# Patient Record
Sex: Female | Born: 1975 | Race: White | Hispanic: No | Smoking: Never smoker
Health system: Southern US, Community
[De-identification: ages and names within clinical notes are randomized; demographics above are authoritative.]

## PROBLEM LIST (undated history)

## (undated) DIAGNOSIS — I10 Essential (primary) hypertension: Secondary | ICD-10-CM

## (undated) HISTORY — DX: Essential (primary) hypertension: I10

## (undated) HISTORY — PX: CHOLECYSTECTOMY: SHX55

---

## 1997-12-30 ENCOUNTER — Emergency Department (HOSPITAL_COMMUNITY): Admission: EM | Admit: 1997-12-30 | Discharge: 1997-12-30 | Payer: Self-pay | Admitting: Emergency Medicine

## 2005-05-18 ENCOUNTER — Other Ambulatory Visit: Admission: RE | Admit: 2005-05-18 | Discharge: 2005-05-18 | Payer: Self-pay | Admitting: Obstetrics and Gynecology

## 2006-02-21 ENCOUNTER — Emergency Department (HOSPITAL_COMMUNITY): Admission: EM | Admit: 2006-02-21 | Discharge: 2006-02-21 | Payer: Self-pay | Admitting: Emergency Medicine

## 2008-08-11 ENCOUNTER — Encounter: Admission: RE | Admit: 2008-08-11 | Discharge: 2008-08-11 | Payer: Self-pay | Admitting: Obstetrics and Gynecology

## 2011-11-29 ENCOUNTER — Telehealth: Payer: Self-pay | Admitting: Obstetrics and Gynecology

## 2011-11-29 MED ORDER — NORETHIN ACE-ETH ESTRAD-FE 1-20 MG-MCG(24) PO CHEW: 1.0000 | CHEWABLE_TABLET | Freq: Every day | ORAL | Status: DC

## 2011-11-29 NOTE — Telephone Encounter (Signed)
VM from pt. No reason given.  Pt [id number]

## 2011-11-29 NOTE — Telephone Encounter (Signed)
Lm on pt vm to call back

## 2011-11-29 NOTE — Telephone Encounter (Signed)
Tc to pt, requests 1 pack of her bc pill, Minastrin to be called in to her pharmacy to last until her aex appt scheduled with EP on 12/25/11. 1 pack sent to pt pharmacy through e-scribe. Pt notified and voiced understanding.

## 2011-12-25 ENCOUNTER — Encounter: Payer: Self-pay | Admitting: Obstetrics and Gynecology

## 2011-12-25 ENCOUNTER — Ambulatory Visit (INDEPENDENT_AMBULATORY_CARE_PROVIDER_SITE_OTHER): Payer: No Typology Code available for payment source | Admitting: Obstetrics and Gynecology

## 2011-12-25 VITALS — BP 130/76 | HR 78 | Ht 61.75 in | Wt 184.0 lb

## 2011-12-25 DIAGNOSIS — Z124 Encounter for screening for malignant neoplasm of cervix: Secondary | ICD-10-CM

## 2011-12-25 DIAGNOSIS — Z01419 Encounter for gynecological examination (general) (routine) without abnormal findings: Secondary | ICD-10-CM

## 2011-12-25 MED ORDER — NORETHIN ACE-ETH ESTRAD-FE 1-20 MG-MCG(24) PO CHEW: 1.0000 | CHEWABLE_TABLET | Freq: Every day | ORAL | Status: DC

## 2011-12-25 NOTE — Progress Notes (Signed)
Subjective:    Elizabeth Mahoney is a 36 y.o. female, G1P0010, who presents for an annual exam. The patient reports no complaints  Menstrual cycle:   LMP: Patient's last menstrual period was 12/04/2011.             Review of Systems Pertinent items are noted in HPI. Denies pelvic pain, urinary tract symptoms, vaginitis symptoms, irregular bleeding, menopausal symptoms, change in bowel habits or rectal bleeding   Objective:    BP 130/76  Pulse 78  Ht 5' 1.75" (1.568 m)  Wt 184 lb (83.462 kg)  BMI 33.93 kg/m2  LMP 12/04/2011    Wt Readings from Last 1 Encounters:  12/25/11 184 lb (83.462 kg)   Body mass index is 33.93 kg/(m^2). General Appearance: Alert, no acute distress HEENT: Grossly normal Neck / Thyroid: Supple, no thyromegaly or cervical adenopathy Lungs: Clear to auscultation bilaterally Back: No CVA tenderness Breast Exam: No masses or nodes.No dimpling, nipple retraction or discharge. Cardiovascular: Regular rate and rhythm.  Gastrointestinal: Soft, non-tender, no masses or organomegaly Pelvic Exam: EGBUS-wnl, vagina-normal rugae, cervix- without lesions or tenderness, uterus appears normal size shape and consistency, adnexae-no masses or tenderness Rectovaginal: no masses and normal sphincter tone Lymphatic Exam: Non-palpable nodes in neck, clavicular,  axillary, or inguinal regions  Skin: no rashes or abnormalities Extremities: no clubbing cyanosis or edema  Neurologic: grossly normal Psychiatric: Alert and oriented  Assessment:   Routine GYN Exam   Plan:  Continue Minastrin FE  #1  1 po qd 11 refills  PAP sent  RTO 1 year or prn  Lera Gaines,ELMIRAPA-C

## 2011-12-25 NOTE — Progress Notes (Signed)
The patient reports: No complaints  Contraception:BCP Minastrin 24 FE  Last mammogram: 08/01/2008 normal Last Pap: 11/03/2010 Normal GC/Chlamydia cultures offered: declined HIV/RPR/HbsAg offered:  declined HSV 1 and 2 glycoprotein offered: declined  Menstrual cycle regular and monthly: Yes every 28 days Menstrual flow normal: Yes lasts 3 days   Urinary symptoms: none Normal bowel movements: Yes Reports abuse at home: No:

## 2011-12-26 LAB — PAP IG W/ RFLX HPV ASCU

## 2013-11-30 ENCOUNTER — Encounter: Payer: Self-pay | Admitting: Obstetrics and Gynecology

## 2016-06-04 ENCOUNTER — Encounter (HOSPITAL_COMMUNITY): Payer: Self-pay | Admitting: Emergency Medicine

## 2016-06-04 DIAGNOSIS — G44209 Tension-type headache, unspecified, not intractable: Secondary | ICD-10-CM | POA: Diagnosis not present

## 2016-06-04 DIAGNOSIS — F321 Major depressive disorder, single episode, moderate: Secondary | ICD-10-CM | POA: Insufficient documentation

## 2016-06-04 DIAGNOSIS — Z79899 Other long term (current) drug therapy: Secondary | ICD-10-CM | POA: Insufficient documentation

## 2016-06-04 DIAGNOSIS — I10 Essential (primary) hypertension: Secondary | ICD-10-CM | POA: Diagnosis not present

## 2016-06-04 DIAGNOSIS — R51 Headache: Secondary | ICD-10-CM | POA: Diagnosis present

## 2016-06-04 LAB — BASIC METABOLIC PANEL
ANION GAP: 9 (ref 5–15)
BUN: 9 mg/dL (ref 6–20)
CHLORIDE: 106 mmol/L (ref 101–111)
CO2: 24 mmol/L (ref 22–32)
Calcium: 9.2 mg/dL (ref 8.9–10.3)
Creatinine, Ser: 0.72 mg/dL (ref 0.44–1.00)
GFR calc non Af Amer: >60 mL/min (ref >60)
Glucose, Bld: 110 mg/dL — ABNORMAL HIGH (ref 65–99)
Potassium: 3.8 mmol/L (ref 3.5–5.1)
Sodium: 139 mmol/L (ref 135–145)

## 2016-06-04 LAB — CBC
HEMATOCRIT: 42.1 % (ref 36.0–46.0)
HEMOGLOBIN: 14 g/dL (ref 12.0–15.0)
MCH: 26.8 pg (ref 26.0–34.0)
MCHC: 33.3 g/dL (ref 30.0–36.0)
MCV: 80.7 fL (ref 78.0–100.0)
Platelets: 312 10*3/uL (ref 150–400)
RBC: 5.22 MIL/uL — AB (ref 3.87–5.11)
RDW: 14.2 % (ref 11.5–15.5)
WBC: 9.5 10*3/uL (ref 4.0–10.5)

## 2016-06-04 LAB — I-STAT BETA HCG BLOOD, ED (MC, WL, AP ONLY): I-stat hCG, quantitative: <5 m[IU]/mL (ref <5)

## 2016-06-04 NOTE — ED Triage Notes (Addendum)
Per EMS pt complaint of generalized weakness on Saturday subsided and started again today in addition to headache. Neuro unremarkable. Pt ambulatory with steady gait to triage room. With triage pt extremities strong throughout, denies pin prick difference when compared to opposite extremity, smile symmetrical.

## 2016-06-05 ENCOUNTER — Emergency Department (HOSPITAL_COMMUNITY)
Admission: EM | Admit: 2016-06-05 | Discharge: 2016-06-05 | Disposition: A | Discharge status: Home / Self Care | Payer: Managed Care, Other (non HMO) | Attending: Emergency Medicine | Admitting: Emergency Medicine

## 2016-06-05 ENCOUNTER — Emergency Department (HOSPITAL_COMMUNITY): Payer: Managed Care, Other (non HMO)

## 2016-06-05 DIAGNOSIS — F321 Major depressive disorder, single episode, moderate: Secondary | ICD-10-CM

## 2016-06-05 DIAGNOSIS — G44209 Tension-type headache, unspecified, not intractable: Secondary | ICD-10-CM

## 2016-06-05 LAB — URINALYSIS, ROUTINE W REFLEX MICROSCOPIC
BILIRUBIN URINE: NEGATIVE
Glucose, UA: NEGATIVE mg/dL
Ketones, ur: NEGATIVE mg/dL
LEUKOCYTES UA: NEGATIVE
NITRITE: NEGATIVE
PH: 5 (ref 5.0–8.0)
Protein, ur: NEGATIVE mg/dL
SPECIFIC GRAVITY, URINE: 1.01 (ref 1.005–1.030)

## 2016-06-05 LAB — HEPATIC FUNCTION PANEL
ALBUMIN: 4.2 g/dL (ref 3.5–5.0)
ALT: 26 U/L (ref 14–54)
AST: 21 U/L (ref 15–41)
Alkaline Phosphatase: 82 U/L (ref 38–126)
BILIRUBIN INDIRECT: 0.6 mg/dL (ref 0.3–0.9)
BILIRUBIN TOTAL: 0.8 mg/dL (ref 0.3–1.2)
Bilirubin, Direct: 0.2 mg/dL (ref 0.1–0.5)
TOTAL PROTEIN: 7.2 g/dL (ref 6.5–8.1)

## 2016-06-05 LAB — RAPID URINE DRUG SCREEN, HOSP PERFORMED
AMPHETAMINES: NOT DETECTED
Barbiturates: NOT DETECTED
Benzodiazepines: NOT DETECTED
Cocaine: NOT DETECTED
Opiates: NOT DETECTED
TETRAHYDROCANNABINOL: NOT DETECTED

## 2016-06-05 LAB — ACETAMINOPHEN LEVEL

## 2016-06-05 LAB — CBG MONITORING, ED: Glucose-Capillary: 103 mg/dL — ABNORMAL HIGH (ref 65–99)

## 2016-06-05 LAB — SALICYLATE LEVEL: Salicylate Lvl: <7 mg/dL (ref 2.8–30.0)

## 2016-06-05 LAB — ETHANOL

## 2016-06-05 MED ORDER — METOCLOPRAMIDE HCL 5 MG/ML IJ SOLN
5.0000 mg | Freq: Once | INTRAMUSCULAR | Status: AC
  Administered 2016-06-05: 5 mg via INTRAVENOUS
  Filled 2016-06-05: qty 2

## 2016-06-05 MED ORDER — SODIUM CHLORIDE 0.9 % IV BOLUS (SEPSIS)
500.0000 mL | Freq: Once | INTRAVENOUS | Status: AC
  Administered 2016-06-05: 500 mL via INTRAVENOUS

## 2016-06-05 MED ORDER — IBUPROFEN 200 MG PO TABS: 600.0000 mg | ORAL_TABLET | Freq: Three times a day (TID) | ORAL | Status: DC | PRN

## 2016-06-05 MED ORDER — DEXAMETHASONE SODIUM PHOSPHATE 10 MG/ML IJ SOLN
10.0000 mg | Freq: Once | INTRAMUSCULAR | Status: AC
  Administered 2016-06-05: 10 mg via INTRAVENOUS
  Filled 2016-06-05: qty 1

## 2016-06-05 MED ORDER — SODIUM CHLORIDE 0.9 % IV BOLUS (SEPSIS)
1000.0000 mL | Freq: Once | INTRAVENOUS | Status: AC
  Administered 2016-06-05: 1000 mL via INTRAVENOUS

## 2016-06-05 MED ORDER — ONDANSETRON HCL 4 MG PO TABS: 4.0000 mg | ORAL_TABLET | Freq: Three times a day (TID) | ORAL | Status: DC | PRN

## 2016-06-05 MED ORDER — DIPHENHYDRAMINE HCL 50 MG/ML IJ SOLN
12.5000 mg | Freq: Once | INTRAMUSCULAR | Status: AC
  Administered 2016-06-05: 12.5 mg via INTRAVENOUS
  Filled 2016-06-05: qty 1

## 2016-06-05 MED ORDER — ZOLPIDEM TARTRATE 10 MG PO TABS: 10.0000 mg | ORAL_TABLET | Freq: Every evening | ORAL | Status: DC | PRN

## 2016-06-05 MED ORDER — ACETAMINOPHEN 325 MG PO TABS: 650.0000 mg | ORAL_TABLET | ORAL | Status: DC | PRN

## 2016-06-05 MED ORDER — ALUM & MAG HYDROXIDE-SIMETH 200-200-20 MG/5ML PO SUSP: 30.0000 mL | ORAL | Status: DC | PRN

## 2016-06-05 NOTE — BH Assessment (Signed)
[facility] Assessment Progress Note  Per Thedore MinsMojeed Akintayo, MD, this pt does not require psychiatric hospitalization at this time.  [facility] to follow up with the Mental Health Intensive Outpatient Program at [facility] [place].  Her scheduled start date is Monday, 06/11/2016 at 08:45.  This has been included in pt's discharge instructions.  Pt's nurse has been notified.  Doylene Canninghomas Amauria Younts, MA Triage Specialist [phone]

## 2016-06-05 NOTE — ED Notes (Signed)
Patient transported to CT 

## 2016-06-05 NOTE — BH Assessment (Addendum)
Assessment Note  Elizabeth Mahoney is an 41 y.o. female. [facility]. Sts that she has been under a lot of stress. Approximately 6 months ago her husband left her. Sts that he gave her a long list of things that she wasn't doing correctly and just left me alone. Sts, "He may may have left me for another women but I'm not really sure". Patient working 2 jobs over the past 2 months. She is also having to care for her home by herself. Sts that everything including yard work is overwhelming for her. She is having a hard time adjusting to living in the home without her spouse. They were married for 22 yrs. Patient has been seeing therapist Elizabeth Mahoney(Elizabeth Mahoney). She thought that therapy was helping but feels she is having a "mental breakdown". She reports ongoing issues with isolating herself from others, fatigue, and hopelessness. She denies HI. No history of aggressive or assaultive behaviors. No legal issues. No AVH's. Patient's father has a history of Schizophrenia. No alcohol or drug use. No history of INPT mental health treatment.   Diagnosis:  Major Depressive Disorder, Recurrent, Severe, without psychotic features  Past Medical History:  Past Medical History:  Diagnosis Date  . Hypertension     Past Surgical History:  Procedure Laterality Date  . CHOLECYSTECTOMY      Family History:  Family History  Problem Relation Age of Onset  . Suicidality Father     Social History:  reports that she has never smoked. She has never used smokeless tobacco. She reports that she drinks alcohol. She reports that she does not use drugs.  Additional Social History:     CIWA: CIWA-Ar BP: (!) 135/93 Pulse Rate: 70 COWS:    Allergies:  Allergies  Allergen Reactions  . Penicillins Nausea Only    Has patient had a PCN reaction causing immediate rash, facial/tongue/throat swelling, SOB or lightheadedness with hypotension: no Has patient had a PCN reaction causing severe  rash involving mucus membranes or skin necrosis: no Has patient had a PCN reaction that required hospitalization: no Has patient had a PCN reaction occurring within the last 10 years: no If all of the above answers are "NO", then may proceed with Cephalosporin use.     Home Medications:  (Not in a Mahoney admission)  OB/GYN Status:  Patient's last menstrual period was 04/30/2016.  General Assessment Data [facility] of Assessment: [facility] TTS Assessment: In system Is this a Tele or Face-to-Face Assessment?: Face-to-Face Is this an Initial Assessment or a Re-assessment for this encounter?: Initial Assessment Marital status: Single Maiden name:  (n/a) Is patient pregnant?: No Pregnancy Status: No Living Arrangements: Alone Can pt return to current living arrangement?: Yes Admission Status: Voluntary Is patient capable of signing voluntary admission?: Yes Referral Source: Self/Family/Friend Insurance type:  Counselling psychologist([employer] )     Crisis Care Plan Living Arrangements: Alone Legal Guardian: Other: (no legal guardian ) Name of Psychiatrist:  (no psychiatrist ) Name of Therapist:  Jeannetta Mahoney(Elizabeth Mahoney )  Education Status Is patient currently in school?: No Current Grade:  (n/a) Highest grade of school patient has completed:  (2 yrs of technical school ) Name of school:  (n/a) Contact person:  (n/a)  Risk to self with the past 6 months Suicidal Ideation: No Has patient been a risk to self within the past 6 months prior to admission? : No Suicidal Intent: No Has patient had any suicidal intent within the past 6 months prior to admission? :  No Is patient at risk for suicide?: No Suicidal Plan?: No Has patient had any suicidal plan within the past 6 months prior to admission? : No Access to Means: No What has been your use of drugs/alcohol within the last 12 months?:  (denies ) Previous Attempts/Gestures: No How many times?:  (0) Other Self Harm Risks:  (denies ) Triggers for Past Attempts:  Other (Comment) (no past attempts or gestures ) Intentional Self Injurious Behavior: None Family Suicide History: Yes (father-Schizophrenia ) Recent stressful life event(s): Other (Comment), Divorce ("My spouse left me"; refinancing, handle house/yard, 2 jobs,) Persecutory voices/beliefs?: No Depression: Yes Depression Symptoms: Feeling angry/irritable, Feeling worthless/self pity, Loss of interest in usual pleasures, Guilt, Fatigue, Isolating, Tearfulness, Insomnia, Despondent Substance abuse history and/or treatment for substance abuse?: No Suicide prevention information given to non-admitted patients: Not applicable  Risk to Others within the past 6 months Homicidal Ideation: No Does patient have any lifetime risk of violence toward others beyond the six months prior to admission? : No Thoughts of Harm to Others: No Current Homicidal Intent: No Current Homicidal Plan: No Access to Homicidal Means: No Identified Victim:  (n/a) History of harm to others?: No Assessment of Violence: None Noted Violent Behavior Description:  (patient calm and cooperative ) Does patient have access to weapons?: No Criminal Charges Pending?: No Does patient have a court date: No Is patient on probation?: No  Psychosis Hallucinations: None noted Delusions: None noted  Mental Status Report Appearance/Hygiene: In scrubs Eye Contact: Good Motor Activity: Freedom of movement Speech: Logical/coherent Level of Consciousness: Alert Mood: Sad Affect: Anxious, Appropriate to circumstance, Depressed Anxiety Level: Minimal Thought Processes: Relevant, Coherent Judgement: Impaired Orientation: Person, Place, Time, Situation Obsessive Compulsive Thoughts/Behaviors: Minimal  Cognitive Functioning Concentration: Decreased Memory: Remote Intact, Recent Intact IQ: Average Insight: Fair Impulse Control: Fair Appetite: Fair Weight Loss:  (33 pounds in the past yr ) Weight Gain:  (none reported ) Sleep:  Decreased Total Hours of Sleep:  (5 to 6 hrs per night ) Vegetative Symptoms: None  ADLScreening [place] [facility]) Patient's cognitive ability adequate to safely complete daily activities?: Yes Patient able to express need for assistance with ADLs?: Yes Independently performs ADLs?: Yes (appropriate for developmental age)  Prior Inpatient Therapy Prior Inpatient Therapy: No Prior Therapy Dates:  (n/a) Prior Therapy Facilty/Provider(s):  (n/a) Reason for Treatment:  (n/a)  Prior Outpatient Therapy Prior Outpatient Therapy: Yes Prior Therapy Dates:  (current ) Prior Therapy Facilty/Provider(s):  Elizabeth Mahoney ) Reason for Treatment:  (therapy/depression) Does patient have an ACCT team?: No Does patient have Intensive In-House Services?  : No Does patient have [facility] services? : No Does patient have P4CC services?: No  ADL Screening (condition at time of admission) Patient's cognitive ability adequate to safely complete daily activities?: Yes Is the patient deaf or have difficulty hearing?: No Does the patient have difficulty seeing, even when wearing glasses/contacts?: No Does the patient have difficulty concentrating, remembering, or making decisions?: No Patient able to express need for assistance with ADLs?: Yes Does the patient have difficulty dressing or bathing?: No Independently performs ADLs?: Yes (appropriate for developmental age) Does the patient have difficulty walking or climbing stairs?: No Weakness of Legs: None Weakness of Arms/Hands: None  Home Assistive Devices/Equipment Home Assistive Devices/Equipment: None    Abuse/Neglect Assessment (Assessment to be complete while patient is alone) Physical Abuse: Denies Verbal Abuse: Denies Sexual Abuse: Denies Exploitation of patient/patient's resources: Denies Self-Neglect: Denies Values / Beliefs Cultural Requests During Hospitalization: None Spiritual Requests  During Hospitalization: None    Advance Directives (For Healthcare) Does Patient Have a Medical Advance Directive?: No Would patient like information on creating a medical advance directive?: No - Patient declined Nutrition Screen- [facility] Patient's home diet: Regular  Additional Information 1:1 In Past 12 Months?: No CIRT Risk: No Elopement Risk: No Does patient have medical clearance?: Yes     Disposition:  Disposition Initial Assessment Completed for this Encounter: Yes  On Site Evaluation by:   Reviewed with Physician:    Melynda Ripple 06/05/2016 9:43 AM

## 2016-06-05 NOTE — BH Assessment (Signed)
Per Dr. Jannifer FranklinAkintayo and Renata Capriceonrad, DNP, patient is psych cleared. Patient [facility] discharge home [facility].

## 2016-06-05 NOTE — ED Provider Notes (Signed)
[facility]-EMERGENCY DEPT Provider Note   CSN: [id number] Arrival date & time: 06/04/16  1826  Time seen 05:30 AM   History   Chief Complaint Chief Complaint  Patient presents with  . Weakness    HPI Elizabeth Mahoney is a 41 y.o. female.  HPI  patient reports on May 5 she started having a headache on the top of her head that she describes as tightness. He states it was gone on 3756. She states it returned yesterday, May 7 around 7 AM. She was started up for the day. She states lights make her eyes hurt. She states playing with her dog outside make the headache feel better. She does not have any noise sensitivity. The pain is not changed by positions. She has no nausea, vomiting, visual changes. She denies any numbness of her extremities. She states both her legs feel tingling. She denies neck pain or fever. She states she's never had this headache before. She denies any recent injury although she states 2 weeks ago she was pulling on a fence and she fell down. She's not sure if she hit her head.  Patient states for the past 2 months she has had 2 jobs. She admits to being fatigued. When we discuss if she is depressed she states "I'm beyond depressed". She does not readily answer if she is suicidal. She states she sees a Veterinary surgeoncounselor and thought she was doing better. She states she does want to see a mental health counselor today once we have her medically feeling better.  PCP none   Past Medical History:  Diagnosis Date  . Hypertension     There are no active problems to display for this patient.   Past Surgical History:  Procedure Laterality Date  . CHOLECYSTECTOMY      OB History    Gravida Para Term Preterm AB Living   1       1 0   SAB TAB Ectopic Multiple Live Births   1               Home Medications    Prior to Admission medications   Medication Sig Start Date End Date Taking? Authorizing Provider  ibuprofen (ADVIL,MOTRIN) 200 MG tablet Take 400 mg by mouth every 6 (six)  hours as needed for headache, mild pain or moderate pain.   Yes [provider]  Melatonin 10 MG CAPS Take 10 mg by mouth at bedtime as needed (sleep).   Yes [provider]  ST JOHNS WORT EXTRACT PO Take 1 tablet by mouth daily.   Yes [provider]  Norethin Ace-Eth Estrad-FE (MINASTRIN 24 FE) 1-20 MG-MCG(24) CHEW Chew 1 tablet by mouth daily. Patient not taking: Reported on 06/05/2016 12/25/11   Henreitta LeberPowell, [place], PA-C    Family History Family History  Problem Relation Age of Onset  . Suicidality Father     Social History Social History  Substance Use Topics  . Smoking status: Never Smoker  . Smokeless tobacco: Never Used  . Alcohol use Yes     Comment: occasional liquor  employed 2 jobs   Allergies   Penicillins   Review of Systems Review of Systems  All other systems reviewed and are negative.    Physical Exam Updated Vital Signs BP (!) 144/88   Pulse 95   Temp 98.4 F (36.9 C) (Oral)   Resp 18   Ht 5\' 2"  (1.575 m)   Wt 184 lb (83.5 kg)   LMP 04/30/2016   SpO2 100%  BMI 33.65 kg/m   Vital signs normal    Physical Exam  Constitutional: She is oriented to person, place, and time. She appears well-developed and well-nourished.  Non-toxic appearance. She does not appear ill. No distress.  Speaks softly  HENT:  Head: Normocephalic and atraumatic.  Right Ear: External ear normal.  Left Ear: External ear normal.  Nose: Nose normal. No mucosal edema or rhinorrhea.  Mouth/Throat: Oropharynx is clear and moist and mucous membranes are normal. No dental abscesses or uvula swelling.  Eyes: Conjunctivae and EOM are normal. Pupils are equal, round, and reactive to light.  Neck: Normal range of motion and full passive range of motion without pain. Neck supple.  Cardiovascular: Normal rate, regular rhythm and normal heart sounds.  Exam reveals no gallop and no friction rub.   No murmur heard. Pulmonary/Chest: Effort normal and breath sounds  normal. No respiratory distress. She has no wheezes. She has no rhonchi. She has no rales. She exhibits no tenderness and no crepitus.  Abdominal: Soft. Normal appearance and bowel sounds are normal. She exhibits no distension. There is no tenderness. There is no rebound and no guarding.  Musculoskeletal: Normal range of motion. She exhibits no edema or tenderness.  Moves all extremities well.   Neurological: She is alert and oriented to person, place, and time. She has normal strength. No cranial nerve deficit.  Grips are equal, patient appears to have difficulty flexing her knees however she states she's walking without difficulty. Her patellar reflexes are 2+ bilaterally  Skin: Skin is warm, dry and intact. No rash noted. No erythema. No pallor.  Psychiatric: Her speech is delayed. She is slowed.  Flat affect with poor eye contact. When we discussed her working the 2 jobs and her feeling depressed she starts getting tearful.  Nursing note and vitals reviewed.    ED Treatments / Results  Labs (all labs ordered are listed, but only abnormal results are displayed) Results for orders placed or performed during the hospital encounter of 06/05/16  Basic metabolic panel  Result Value Ref Range   Sodium 139 135 - 145 mmol/L   Potassium 3.8 3.5 - 5.1 mmol/L   Chloride 106 101 - 111 mmol/L   CO2 24 22 - 32 mmol/L   Glucose, Bld 110 (H) 65 - 99 mg/dL   BUN 9 6 - 20 mg/dL   Creatinine, Ser 1.61 0.44 - 1.00 mg/dL   Calcium 9.2 8.9 - 09.6 mg/dL   GFR calc non Af Amer >60 >60 mL/min   GFR calc Af Amer >60 >60 mL/min   Anion gap 9 5 - 15  CBC  Result Value Ref Range   WBC 9.5 4.0 - 10.5 K/uL   RBC 5.22 (H) 3.87 - 5.11 MIL/uL   Hemoglobin 14.0 12.0 - 15.0 g/dL   HCT 04.5 40.9 - 81.1 %   MCV 80.7 78.0 - 100.0 fL   MCH 26.8 26.0 - 34.0 pg   MCHC 33.3 30.0 - 36.0 g/dL   RDW 91.4 78.2 - 95.6 %   Platelets 312 150 - 400 K/uL  Urinalysis, Routine w reflex microscopic  Result Value Ref Range    Color, Urine YELLOW YELLOW   APPearance CLEAR CLEAR   Specific Gravity, Urine 1.010 1.005 - 1.030   pH 5.0 5.0 - 8.0   Glucose, UA NEGATIVE NEGATIVE mg/dL   Hgb urine dipstick LARGE (A) NEGATIVE   Bilirubin Urine NEGATIVE NEGATIVE   Ketones, ur NEGATIVE NEGATIVE mg/dL   Protein, ur NEGATIVE  NEGATIVE mg/dL   Nitrite NEGATIVE NEGATIVE   Leukocytes, UA NEGATIVE NEGATIVE   RBC / HPF 6-30 0 - 5 RBC/hpf   WBC, UA 0-5 0 - 5 WBC/hpf   Bacteria, UA RARE (A) NONE SEEN   Squamous Epithelial / LPF 0-5 (A) NONE SEEN   Mucous PRESENT   Urine rapid drug screen (hosp performed)  Result Value Ref Range   Opiates NONE DETECTED NONE DETECTED   Cocaine NONE DETECTED NONE DETECTED   Benzodiazepines NONE DETECTED NONE DETECTED   Amphetamines NONE DETECTED NONE DETECTED   Tetrahydrocannabinol NONE DETECTED NONE DETECTED   Barbiturates NONE DETECTED NONE DETECTED  Hepatic function panel  Result Value Ref Range   Total Protein 7.2 6.5 - 8.1 g/dL   Albumin 4.2 3.5 - 5.0 g/dL   AST 21 15 - 41 U/L   ALT 26 14 - 54 U/L   Alkaline Phosphatase 82 38 - 126 U/L   Total Bilirubin 0.8 0.3 - 1.2 mg/dL   Bilirubin, Direct 0.2 0.1 - 0.5 mg/dL   Indirect Bilirubin 0.6 0.3 - 0.9 mg/dL  Ethanol  Result Value Ref Range   Alcohol, Ethyl (B) <5 <5 mg/dL  Acetaminophen level  Result Value Ref Range   Acetaminophen (Tylenol), Serum <10 (L) 10 - 30 ug/mL  Salicylate level  Result Value Ref Range   Salicylate Lvl <7.0 2.8 - 30.0 mg/dL  CBG monitoring, ED  Result Value Ref Range   Glucose-Capillary 103 (H) 65 - 99 mg/dL  I-Stat Beta hCG blood, ED ([facility], [facility], [facility] only)  Result Value Ref Range   I-stat hCG, quantitative <5.0 <5 mIU/mL   Comment 3           Laboratory interpretation all normal except Microscopic hematuria, patient does not remember when her last menstrual cycle was or when it supposed to start again.    EKG  EKG Interpretation None       Radiology Ct Head Wo Contrast  Result Date:  06/05/2016 CLINICAL DATA:  41 y/o  F; fall 2 weeks ago with headache. EXAM: CT HEAD WITHOUT CONTRAST TECHNIQUE: Contiguous axial images were obtained from the base of the skull through the vertex without intravenous contrast. COMPARISON:  None. FINDINGS: Brain: No evidence of acute infarction, hemorrhage, hydrocephalus, extra-axial collection or mass lesion/mass effect. Vascular: No hyperdense vessel or unexpected calcification. Skull: Normal. Negative for fracture or focal lesion. Sinuses/Orbits: No acute finding. Other: None. IMPRESSION: No acute intracranial abnormality identified. Unremarkable CT of the head for age. Electronically Signed   By: Mitzi Hansen M.D.   On: 06/05/2016 06:23    Procedures Procedures (including critical care time)  Medications Ordered in ED Medications  acetaminophen (TYLENOL) tablet 650 mg (not administered)  ibuprofen (ADVIL,MOTRIN) tablet 600 mg (not administered)  zolpidem (AMBIEN) tablet 10 mg (not administered)  ondansetron (ZOFRAN) tablet 4 mg (not administered)  alum & mag hydroxide-simeth (MAALOX/MYLANTA) 200-200-20 MG/5ML suspension 30 mL (not administered)  sodium chloride 0.9 % bolus 1,000 mL (0 mLs Intravenous Stopped 06/05/16 0721)  sodium chloride 0.9 % bolus 500 mL (500 mLs Intravenous New Bag/Given 06/05/16 0721)  metoCLOPramide (REGLAN) injection 5 mg (5 mg Intravenous Given 06/05/16 0617)  diphenhydrAMINE (BENADRYL) injection 12.5 mg (12.5 mg Intravenous Given 06/05/16 0617)  dexamethasone (DECADRON) injection 10 mg (10 mg Intravenous Given 06/05/16 0617)     Initial Impression / Assessment and Plan / ED Course  I have reviewed the triage vital signs and the nursing notes.  Pertinent labs & imaging results that  were available during my care of the patient were reviewed by me and considered in my medical decision making (see chart for details).  She was given IV fluids and low-dose of the migraine cocktail. Due to her having a fall about 2  weeks ago and new onset headaches a CT of the head was done. More likely however her headache is from stress or tension. After her headache is controlled she will have a TTS evaluation.  Recheck at 7:45 AM. Patient states her headache is almost gone. We discussed her head CT results. She is eating breakfast. She is ready to have her TTS consult.  Final Clinical Impressions(s) / ED Diagnoses   Final diagnoses:  Tension headache  Current moderate episode of major depressive disorder without prior episode (HCC)    Disposition pending  Devoria Albe, MD, Concha Pyo, MD 06/05/16 [phone]

## 2016-06-05 NOTE — ED Notes (Signed)
Report given to TCU RN.  

## 2016-06-05 NOTE — ED Notes (Signed)
Patient resting but still waiting to see provider.

## 2016-06-05 NOTE — Discharge Instructions (Signed)
Take ibuprofen 600 mg + acetaminophen 650 mg 4 times a day for headache as needed. Try to get rest. Follow up as discussed with the mental health counselor today.   For your ongoing behavioral health needs, you are advised to follow up with the Mental Health Intensive Outpatient Program (MH-IOP) at the [facility] [place].  You are scheduled to start the program on Monday, Jun 11, 2016 at 8:45 am.  They will be calling you on Friday, Jun 08, 2016 to finalize arrangements.  If you have any questions, contact Jeri Modenaita Clark, MEd, who coordinates the program:       [facility]      [address]      [place], [place] [id number]      Contact person: Jeri ModenaRita Clark, MEd      [phone]

## 2016-06-13 ENCOUNTER — Ambulatory Visit: Payer: Self-pay | Admitting: Physician Assistant

## 2016-06-13 ENCOUNTER — Encounter: Payer: Self-pay | Admitting: Physician Assistant

## 2016-06-13 VITALS — BP 110/70 | HR 77 | Temp 98.5°F

## 2016-06-13 DIAGNOSIS — F32A Depression, unspecified: Secondary | ICD-10-CM

## 2016-06-13 DIAGNOSIS — F329 Major depressive disorder, single episode, unspecified: Secondary | ICD-10-CM

## 2016-06-13 MED ORDER — SERTRALINE HCL 50 MG PO TABS: 50.0000 mg | ORAL_TABLET | Freq: Every day | ORAL | 3 refills | Status: DC

## 2016-06-13 NOTE — Progress Notes (Signed)
S: c/o having depression sx, was suicidal after her husband left her and started going to the [facility], was doing really well and all of sudden had weird pain and panic attacks, ended up in the ER and they told her it was due to her depression, ct head was neg, labs were normal, [facility] also recommended she get on medication, no si/hi at this time  O: vitals wnl, nad, lungs c t a, cv rrr  A: depression  P: zoloft 50mg  qd, recheck in 1 month, labs for thyroid panel, b12, vit d

## 2016-06-14 ENCOUNTER — Other Ambulatory Visit: Payer: Self-pay | Admitting: Physician Assistant

## 2016-06-14 LAB — THYROID PANEL WITH TSH
Free Thyroxine Index: 1.9 (ref 1.2–4.9)
T3 Uptake Ratio: 28 % (ref 24–39)
T4, Total: 6.9 ug/dL (ref 4.5–12.0)
TSH: 0.709 u[IU]/mL (ref 0.450–4.500)

## 2016-06-14 LAB — VITAMIN D 25 HYDROXY (VIT D DEFICIENCY, FRACTURES): Vit D, 25-Hydroxy: 18.2 ng/mL — ABNORMAL LOW (ref 30.0–100.0)

## 2016-06-14 LAB — B12 AND FOLATE PANEL
Folate: 6.9 ng/mL (ref >3.0)
VITAMIN B 12: 502 pg/mL (ref 232–1245)

## 2016-06-14 MED ORDER — VITAMIN D (ERGOCALCIFEROL) 1.25 MG (50000 UNIT) PO CAPS: 50000.0000 [IU] | ORAL_CAPSULE | ORAL | 3 refills | Status: DC

## 2016-06-14 NOTE — Progress Notes (Unsigned)
Vit d deficient, sent rx to pharmacy

## 2016-09-29 ENCOUNTER — Other Ambulatory Visit: Payer: Self-pay | Admitting: Physician Assistant

## 2016-10-04 ENCOUNTER — Other Ambulatory Visit: Payer: Self-pay | Admitting: Physician Assistant

## 2016-10-08 ENCOUNTER — Encounter: Payer: Self-pay | Admitting: Physician Assistant

## 2016-10-08 ENCOUNTER — Ambulatory Visit: Payer: Self-pay | Admitting: Physician Assistant

## 2016-10-08 VITALS — BP 125/70 | HR 75 | Temp 98.5°F | Resp 16

## 2016-10-08 DIAGNOSIS — F411 Generalized anxiety disorder: Secondary | ICD-10-CM

## 2016-10-08 MED ORDER — SERTRALINE HCL 50 MG PO TABS: 50.0000 mg | ORAL_TABLET | Freq: Every day | ORAL | 6 refills | Status: DC

## 2016-10-08 NOTE — Progress Notes (Signed)
S: here for med refill for depression medication, still seeing the EAP and is doing much better, counselor said she could try and wean off of the medication in January, no problems with medication, no si/hi  O:v itals wnl, nad, lungs c t a, cv rrr, good mood and affect  A: med refill for anxiety/depression  P: refill on zoloft 50 mg qd

## 2016-10-15 ENCOUNTER — Ambulatory Visit: Payer: Self-pay | Admitting: Physician Assistant

## 2017-05-10 ENCOUNTER — Ambulatory Visit: Payer: Self-pay | Admitting: Family Medicine

## 2017-05-10 VITALS — BP 144/88 | HR 83 | Temp 98.4°F | Resp 20 | Ht 62.0 in | Wt 203.0 lb

## 2017-05-10 DIAGNOSIS — Z0189 Encounter for other specified special examinations: Principal | ICD-10-CM

## 2017-05-10 DIAGNOSIS — Z008 Encounter for other general examination: Secondary | ICD-10-CM

## 2017-05-10 LAB — GLUCOSE, POCT (MANUAL RESULT ENTRY): POC Glucose: 93 mg/dl (ref 70–99)

## 2017-05-10 NOTE — Progress Notes (Signed)
Subjective: Annual biometrics screening  Patient presents for her annual biometric screening. Patient denies any medical problems.  Patient reports eating a generally well-balanced diet and that she does not eat any fried foods.  Patient reports she does have a problem with eating sugary foods on occasion. Patient works for [employer]. Patient denies any other issues or concerns.   Review of Systems Unremarkable  Objective  Physical Exam General: Awake, alert and oriented. No acute distress. Well developed, hydrated and nourished. Appears stated age.  HEENT: Supple neck without adenopathy. Sclera is non-icteric. The ear canal is clear without discharge. The tympanic membrane is normal in appearance with normal landmarks and cone of light. Nasal mucosa is pink and moist. Oral mucosa is pink and moist. The pharynx is normal in appearance without tonsillar swelling or exudates.  Skin: Skin in warm, dry and intact without rashes or lesions. Appropriate color for ethnicity. Cardiac: Heart rate and rhythm are normal. No murmurs, gallops, or rubs are auscultated.  Respiratory: The chest wall is symmetric and without deformity. No signs of respiratory distress. Lung sounds are clear in all lobes bilaterally without rales, ronchi, or wheezes.  Neurological: The patient is awake, alert and oriented to person, place, and time with normal speech.  Memory is normal and thought processes intact. No gait abnormalities are appreciated.  Psychiatric: Appropriate mood and affect.   Assessment Annual biometrics screening  Plan  Lipid panel pending. Encouraged routine visits with primary care provider.  Fasting blood sugar 93 today.  Patient's blood pressure elevated at 144/88 today.  Patient reports this is related to having her blood drawn immediately before having her blood pressure taken.  Patient checks her blood pressure periodically and it is typically much lower than this.  Advised patient to check this  periodically and to report values of 140/90 or higher to her PCP.  Encouraged a well-balanced diet and exercise.

## 2017-05-11 LAB — LIPID PANEL
CHOL/HDL RATIO: 2.8 ratio (ref 0.0–4.4)
CHOLESTEROL TOTAL: 165 mg/dL (ref 100–199)
HDL: 58 mg/dL (ref >39)
LDL CALC: 91 mg/dL (ref 0–99)
TRIGLYCERIDES: 79 mg/dL (ref 0–149)
VLDL CHOLESTEROL CAL: 16 mg/dL (ref 5–40)

## 2017-05-13 NOTE — Progress Notes (Signed)
Lipid panel WNL

## 2017-10-05 IMAGING — CT CT HEAD W/O CM
3 of 4 series · 15 of 47 positions shown, 18 images · non-contrast
Comparison: None.

CLINICAL DATA: 41 y/o  F; fall 2 weeks ago with headache.

EXAM:
CT HEAD WITHOUT CONTRAST
TECHNIQUE: Contiguous axial images were obtained from the base of the skull
through the vertex without intravenous contrast.

[Series 2: head w/o · axial · non-contrast · 0.45mm/px · z∈[-215,-95]mm · 9 of 31 slices shown, 12 images]
[im 4/31  brain]
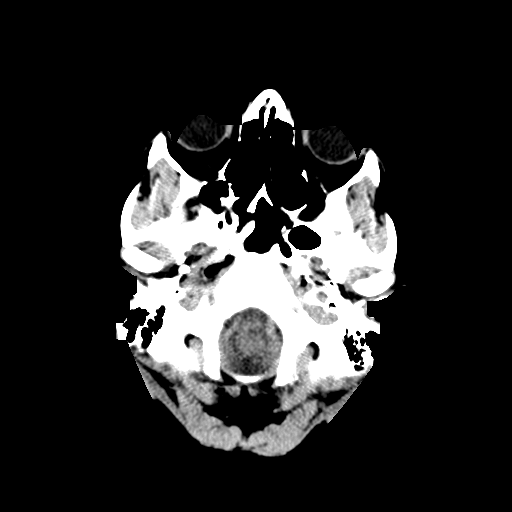
[im 4/31  bone]
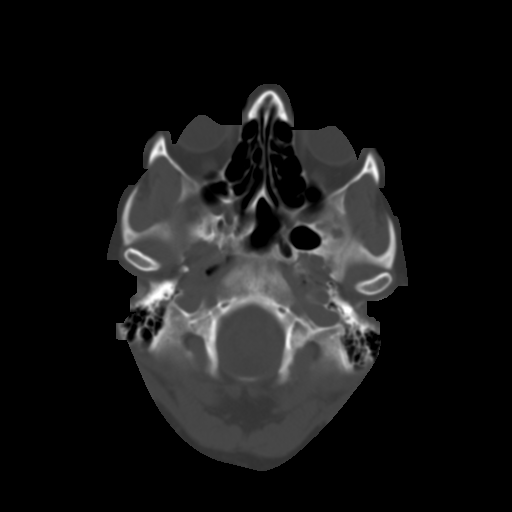
[im 7/31  brain]
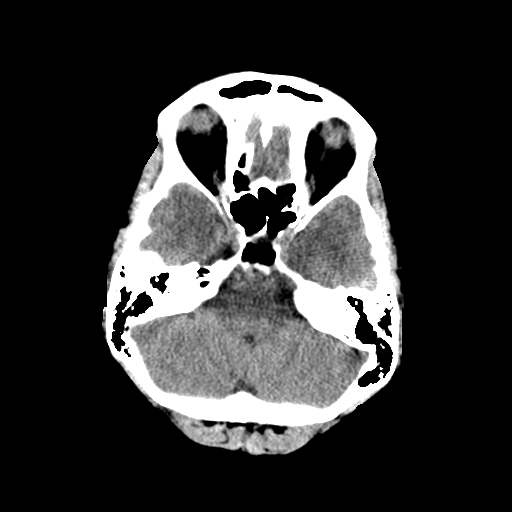
[im 10/31  brain]
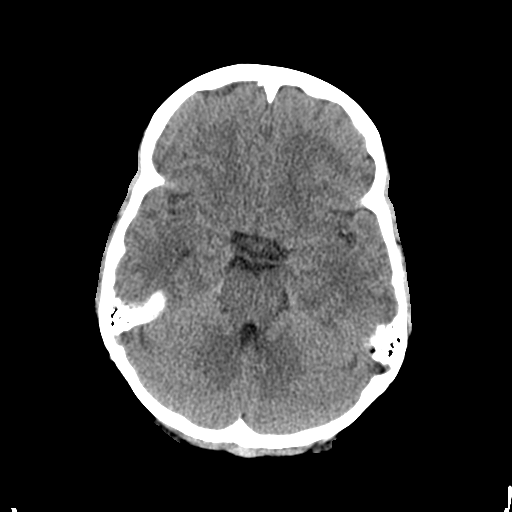
[im 13/31  brain]
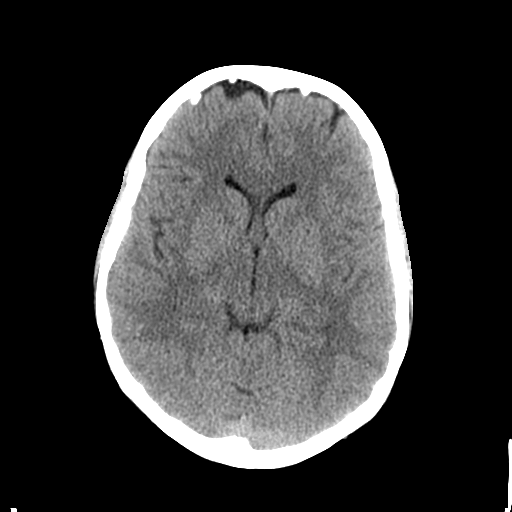
[im 16/31  brain]
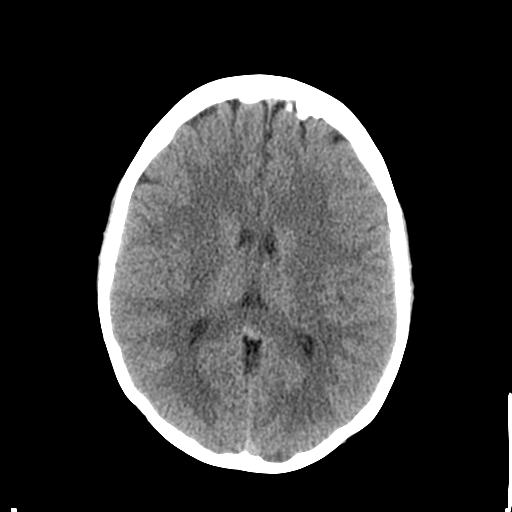
[im 16/31  bone]
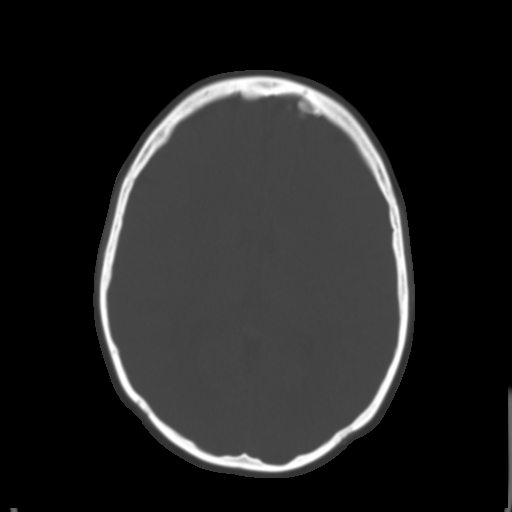
[im 19/31  brain]
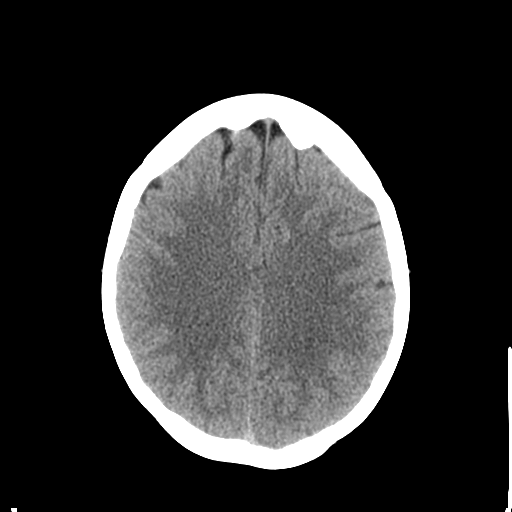
[im 22/31  brain]
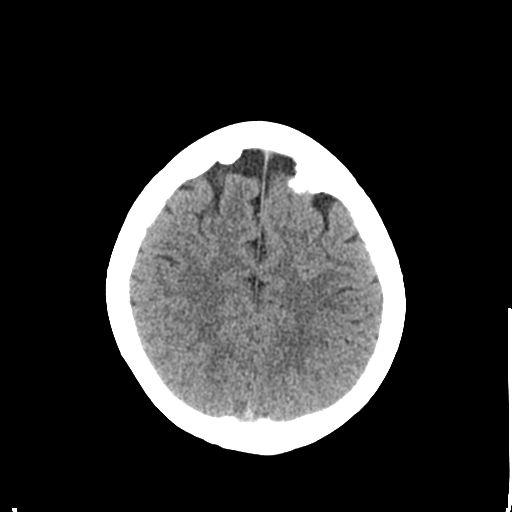
[im 25/31  brain]
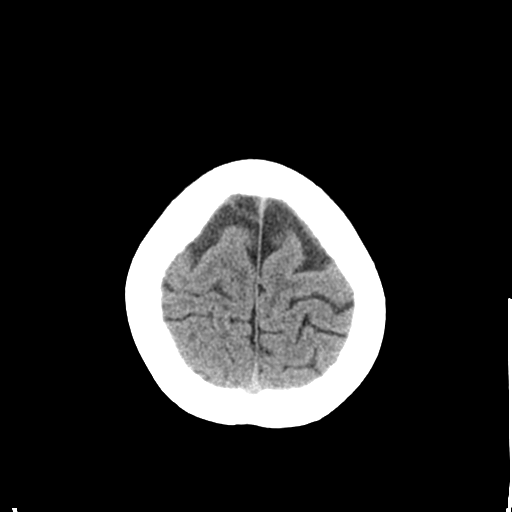
[im 28/31  brain]
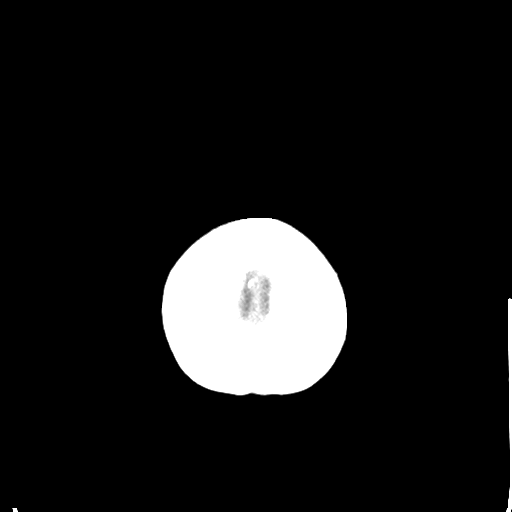
[im 28/31  bone]
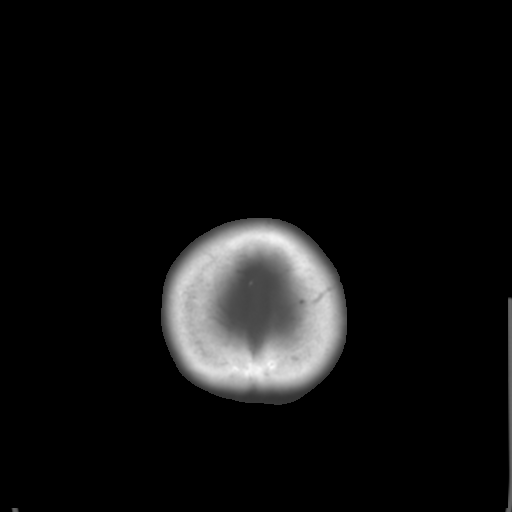

[Series 4: coronal · coronal · 0.27mm/px · 3 of 60 slices shown]
[im 20/60  brain]
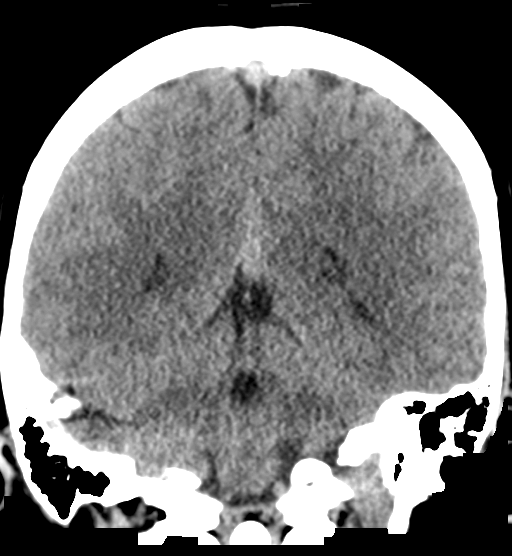
[im 27/60  brain]
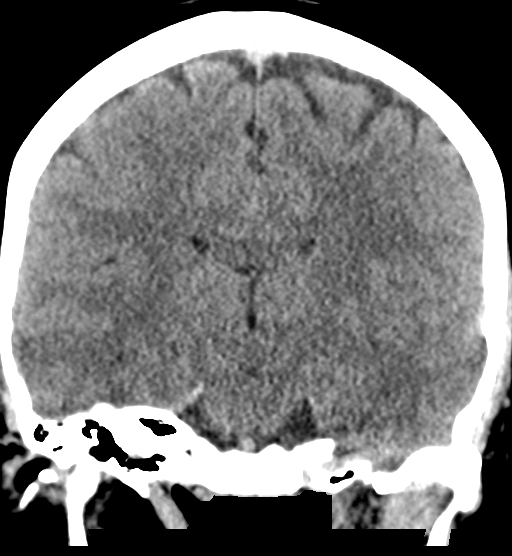
[im 33/60  brain]
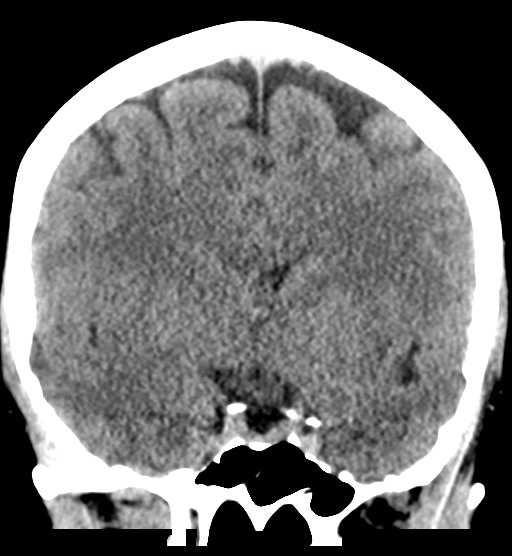

[Series 5: sagittal · sagittal · 0.29mm/px · 3 of 48 slices shown]
[im 16/48  brain]
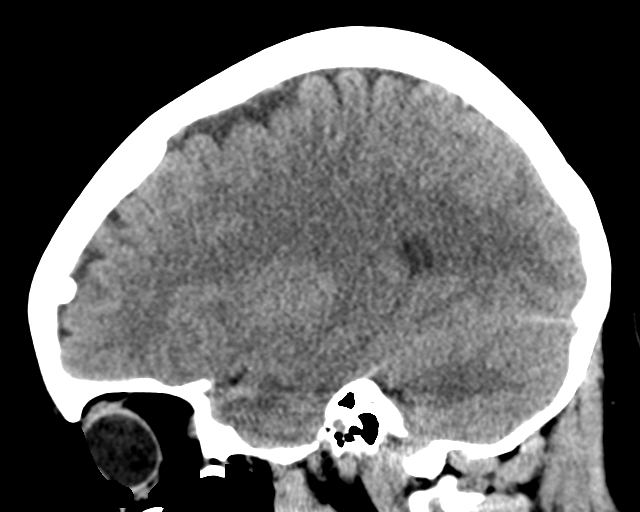
[im 24/48  brain]
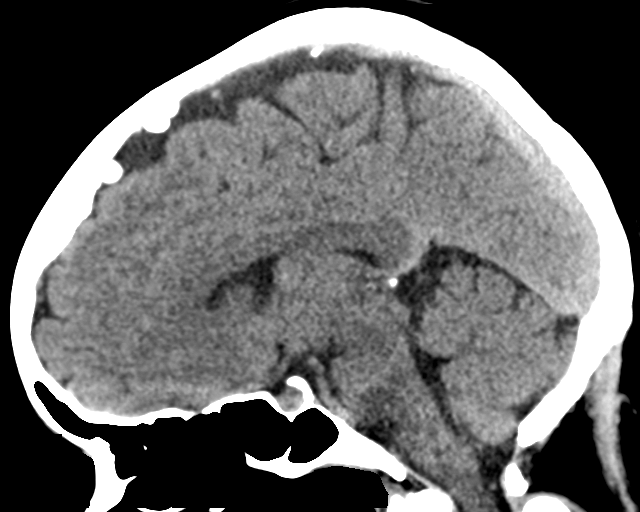
[im 32/48  brain]
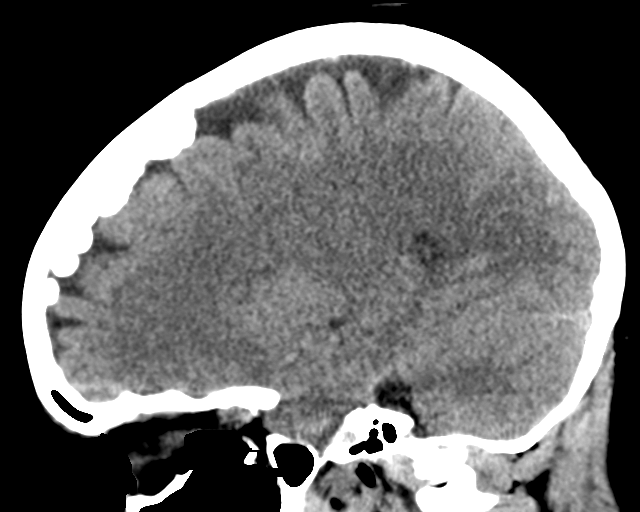

[15 of 47 positions shown; findings below may reference images not displayed]

FINDINGS: Brain: No evidence of acute infarction, hemorrhage, hydrocephalus,
extra-axial collection or mass lesion/mass effect.

Vascular: No hyperdense vessel or unexpected calcification.

Skull: Normal. Negative for fracture or focal lesion.

Sinuses/Orbits: No acute finding.

Other: None.
IMPRESSION: No acute intracranial abnormality identified. Unremarkable CT of the
head for age.

By: Khalilah Holub M.D.

## 2018-10-21 ENCOUNTER — Other Ambulatory Visit: Payer: Self-pay

## 2018-10-21 ENCOUNTER — Ambulatory Visit: Payer: Managed Care, Other (non HMO) | Admitting: Adult Health

## 2018-10-21 ENCOUNTER — Encounter: Payer: Self-pay | Admitting: Adult Health

## 2018-10-21 VITALS — BP 118/82 | HR 77 | Temp 97.3°F | Resp 18 | Ht 63.0 in | Wt 208.0 lb

## 2018-10-21 DIAGNOSIS — Z0189 Encounter for other specified special examinations: Secondary | ICD-10-CM | POA: Diagnosis not present

## 2018-10-21 DIAGNOSIS — Z008 Encounter for other general examination: Secondary | ICD-10-CM

## 2018-10-21 DIAGNOSIS — E669 Obesity, unspecified: Secondary | ICD-10-CM | POA: Insufficient documentation

## 2018-10-21 NOTE — Progress Notes (Signed)
[facility]  Elizabeth Mahoney DOB: 43 y.o. MRN: [id number]  Subjective:  Here for Biometric Screen/brief exam Patient is a 43 year old female in no acute distress who comes to the clinic for a biometric screening and brief exam.  Reports he does have a primary care provider but she has not gone recently, she has not had a Pap smear in 3 years and knows that she needs to schedule one with her gynecologist. She denies any health concerns at this visit reports she feels well tries to exercise and eat healthy. He denies taking any daily medications or over-the-counter medications.  She denies any chronic health problems or concerns Patient  denies any fever, body aches,chills, rash, chest pain, shortness of breath, nausea, vomiting, or diarrhea.   Objective: Blood pressure 118/82, pulse 77, temperature (!) 97.3 F (36.3 C), temperature source Temporal, resp. rate 18, height 5\' 3"  (1.6 m), weight 208 lb (94.3 kg), SpO2 98 %.  Temperature is 98.3 with 1 degree added as temporal thermometer reads 1 degree lower than normal  NAD, well-developed well-nourished HEENT: Within normal limits Neck: Normal, supple, no cervical lymphadenopathy Heart: Regular rate and rhythm without murmurs rubs or gallops Lungs: Clear to auscultation without any adventitious lung sounds Patient is alert and oriented and responsive to questions Engages in eye contact with provider. Speaks in full sentences without any pauses without any shortness of breath or distress.  Patient moves on and off of exam table and in room without difficulty. Gait is normal in hall and in room. Patient is oriented to person place time and situation. Patient answers questions appropriately and engages in conversation.   Assessment: Biometric screen Encounter for other general examination-not a full annual physical, biometric screening and brief exam only - Plan: Lipid Panel With LDL/HDL Ratio,  Glucose, random  Encounter for biometric screening - Plan: Lipid Panel With LDL/HDL Ratio, Glucose, random    Plan: The Biometric exam is a brief physical with labs including glucose and cholesterol. This does not replace a full physical with a primary care provider, and additional recommended labs. This is an acute care clinic not for maintenance of chronic or long standing conditions.   Provider also recommends if you do not have a primary care provider for patient to establish care promptly.You can choose any provider of your choice at any facility of your choice, the below information is  just a resource to aid in you finding a primary care provider for routine health maintenance.   [place]  PHYSICIAN/PROVIDER  REFERRAL LINE at [phone]  [email or web address] to help assist with finding a primary care doctor.   Helpful resources below of other primary care office's accepting new patients.   [phone]         [address]  [place]. [place] [phone]  . [facility]    [address] [place], [place] [phone]  . [facility] [address]  [place], [place]  [phone]   . [phone] [facility]  [address], [address] [place] [phone]    Follow up with primary care as needed for chronic and maintenance health care- can be seen in this employee clinic for acute care.    I will have the office call you on your glucose and cholesterol results when they return if you have not heard within 1 week please  call the office.  This biometric physical is a brief physical and the only labs done are glucose and your lipid panel(cholesterol) and is  not a substitute for seeing a primary care provider for a complete annual physical. Please see a primary care physician for routine health maintenance, labs and full physical at  least yearly and follow up as recommended by your provider. Provider also recommends if you do not have a primary care provider for patient to establish care as soon as possible .Patient may chose provider of choice. Also gave the [place] at [phone] or web site at [email or web address] to help assist with finding a primary care doctor.  Patient verbalizes understanding that his office is acute care only and not a substitute for a primary care or for the management of chronic conditions.    Fasting glucose and lipids. Discussed with patient that today's visit here is a limited biometric screening visit (not a comprehensive exam or management of any chronic problems) Discussed some health issues, including healthy eating habits and exercise. Encouraged to follow-up with PCP for annual comprehensive preventive and wellness care (and if applicable, any chronic issues). Questions invited and answered.

## 2018-10-21 NOTE — Patient Instructions (Signed)
Provider also recommends patient see primary care physician for a routine physical and to establish primary care. Patient may chose provider of choice. Also gave the [place] at [phone] or web site at [email or web address] to help assist with finding a primary care doctor. Patient understands this office is acute care office only and no primary care is done at this office.   The Biometric exam is a brief physical with labs including glucose and cholesterol. This does not replace a full physical with a primary care provider, and additional recommended labs. This is an acute care clinic not for maintenance of chronic or long standing conditions.   Provider also recommends if you do not have a primary care provider for patient to establish care promptly.You can choose any provider of your choice at any facility of your choice, the below information is  just a resource to aid in you finding a primary care provider for routine health maintenance.   [place]  PHYSICIAN/PROVIDER  REFERRAL LINE at [phone]  [email or web address] to help assist with finding a primary care doctor.   Helpful resources below of other primary care office's accepting new patients.   [facility] P[facility]         [address]  [place]. [place] [address] [phone]  . [facility]    [address], [place], [place] [address] [phone]  . [facility] [address] [place], [place]  [phone]   . [place] [facility]  [address] [place] [address] [phone]    Follow up with primary care as needed for chronic and maintenance health care- can be seen in this employee clinic for acute care.    I will have the office call you on your glucose and cholesterol results when they return if you have not heard within 1 week please call the office.  This  biometric physical is a brief physical and the only labs done are glucose and your lipid panel(cholesterol) and is  not a substitute for seeing a primary care provider for a complete annual physical. Please see a primary care physician for routine health maintenance, labs and full physical at least yearly and follow up as recommended by your provider. Provider also recommends if you do not have a primary care provider for patient to establish care as soon as possible .Patient may chose provider of choice. Also gave the [place] at [phone] or web site at [email or web address] to help assist with finding a primary care doctor.  Patient verbalizes understanding that his office is acute care only and not a substitute for a primary care or for the management of chronic conditions.    Health Maintenance, Female Adopting a healthy lifestyle and getting preventive care are important in promoting health and wellness. Ask your health care provider about:  The right schedule for you to have regular tests and exams.  Things you can do on your own to prevent diseases and keep yourself healthy. What should I know about diet, weight, and exercise? Eat a healthy diet   Eat a diet that includes plenty of vegetables, fruits, low-fat dairy products, and lean protein.  Do not eat a lot of foods that are high in solid fats, added sugars, or sodium. Maintain a healthy weight Body mass index (BMI) is used to identify weight problems.  It estimates body fat based on height and weight. Your health care provider can help determine your BMI and help you achieve or maintain a healthy weight. Get regular exercise Get regular exercise. This is one of the most important things you can do for your health. Most adults should:  Exercise for at least 150 minutes each week. The exercise should increase your heart rate and make you sweat (moderate-intensity exercise).  Do strengthening  exercises at least twice a week. This is in addition to the moderate-intensity exercise.  Spend less time sitting. Even light physical activity can be beneficial. Watch cholesterol and blood lipids Have your blood tested for lipids and cholesterol at 43 years of age, then have this test every 5 years. Have your cholesterol levels checked more often if:  Your lipid or cholesterol levels are high.  You are older than 43 years of age.  You are at high risk for heart disease. What should I know about cancer screening? Depending on your health history and family history, you may need to have cancer screening at various ages. This may include screening for:  Breast cancer.  Cervical cancer.  Colorectal cancer.  Skin cancer.  Lung cancer. What should I know about heart disease, diabetes, and high blood pressure? Blood pressure and heart disease  High blood pressure causes heart disease and increases the risk of stroke. This is more likely to develop in people who have high blood pressure readings, are of African descent, or are overweight.  Have your blood pressure checked: ? Every 3-5 years if you are 10-45 years of age. ? Every year if you are 41 years old or older. Diabetes Have regular diabetes screenings. This checks your fasting blood sugar level. Have the screening done:  Once every three years after age 45 if you are at a normal weight and have a low risk for diabetes.  More often and at a younger age if you are overweight or have a high risk for diabetes. What should I know about preventing infection? Hepatitis B If you have a higher risk for hepatitis B, you should be screened for this virus. Talk with your health care provider to find out if you are at risk for hepatitis B infection. Hepatitis C Testing is recommended for:  Everyone born from [birth year] through 1965.  Anyone with known risk factors for hepatitis C. Sexually transmitted infections (STIs)  Get screened  for STIs, including gonorrhea and chlamydia, if: ? You are sexually active and are younger than 43 years of age. ? You are older than 43 years of age and your health care provider tells you that you are at risk for this type of infection. ? Your sexual activity has changed since you were last screened, and you are at increased risk for chlamydia or gonorrhea. Ask your health care provider if you are at risk.  Ask your health care provider about whether you are at high risk for HIV. Your health care provider may recommend a prescription medicine to help prevent HIV infection. If you choose to take medicine to prevent HIV, you should first get tested for HIV. You should then be tested every 3 months for as long as you are taking the medicine. Pregnancy  If you are about to stop having your period (premenopausal) and you may become pregnant, seek counseling before you get pregnant.  Take 400 to 800 micrograms (mcg) of folic acid every day if you become pregnant.  Ask for birth control (contraception) if  you want to prevent pregnancy. Osteoporosis and menopause Osteoporosis is a disease in which the bones lose minerals and strength with aging. This can result in bone fractures. If you are 31 years old or older, or if you are at risk for osteoporosis and fractures, ask your health care provider if you should:  Be screened for bone loss.  Take a calcium or vitamin D supplement to lower your risk of fractures.  Be given hormone replacement therapy (HRT) to treat symptoms of menopause. Follow these instructions at home: Lifestyle  Do not use any products that contain nicotine or tobacco, such as cigarettes, e-cigarettes, and chewing tobacco. If you need help quitting, ask your health care provider.  Do not use street drugs.  Do not share needles.  Ask your health care provider for help if you need support or information about quitting drugs. Alcohol use  Do not drink alcohol if: ? Your  health care provider tells you not to drink. ? You are pregnant, may be pregnant, or are planning to become pregnant.  If you drink alcohol: ? Limit how much you use to 0-1 drink a day. ? Limit intake if you are breastfeeding.  Be aware of how much alcohol is in your drink. In the [place] one drink equals one 12 oz bottle of beer (355 mL), one 5 oz glass of wine (148 mL), or one 1 oz glass of hard liquor (44 mL). General instructions  Schedule regular health, dental, and eye exams.  Stay current with your vaccines.  Tell your health care provider if: ? You often feel depressed. ? You have ever been abused or do not feel safe at home. Summary  Adopting a healthy lifestyle and getting preventive care are important in promoting health and wellness.  Follow your health care provider's instructions about healthy diet, exercising, and getting tested or screened for diseases.  Follow your health care provider's instructions on monitoring your cholesterol and blood pressure. This information is not intended to replace advice given to you by your health care provider. Make sure you discuss any questions you have with your health care provider. Document Released: 07/31/2010 Document Revised: 01/08/2018 Document Reviewed: 01/08/2018 Elsevier Patient Education  2020 [employer].

## 2018-10-22 LAB — LIPID PANEL WITH LDL/HDL RATIO
Cholesterol, Total: 175 mg/dL (ref 100–199)
HDL: 55 mg/dL (ref >39)
LDL Chol Calc (NIH): 104 mg/dL — ABNORMAL HIGH (ref 0–99)
LDL/HDL Ratio: 1.9 ratio (ref 0.0–3.2)
Triglycerides: 89 mg/dL (ref 0–149)
VLDL Cholesterol Cal: 16 mg/dL (ref 5–40)

## 2018-10-22 LAB — GLUCOSE, RANDOM: Glucose: 94 mg/dL (ref 65–99)

## 2019-09-14 ENCOUNTER — Ambulatory Visit: Payer: Managed Care, Other (non HMO) | Attending: Internal Medicine

## 2019-09-14 DIAGNOSIS — Z23 Encounter for immunization: Secondary | ICD-10-CM

## 2019-09-14 NOTE — Progress Notes (Signed)
   Covid-19 Vaccination Clinic  Name:  Elizabeth Mahoney    MRN: [id number] DOB: Dec 17, 1975  09/14/2019  [name] was observed post Covid-19 immunization for 15 minutes without incident. She was provided with Vaccine Information Sheet and instruction to access the V-Safe system.   [name] was instructed to call 911 with any severe reactions post vaccine: Marland Kitchen Difficulty breathing  . Swelling of face and throat  . A fast heartbeat  . A bad rash all over body  . Dizziness and weakness   Immunizations Administered    Name Date Dose VIS Date Route   Pfizer COVID-19 Vaccine 09/14/2019  2:35 PM 0.3 mL 03/25/2018 Intramuscular   Manufacturer: [employer], [employer]   Lot: [id number]   NDC: [address]-1

## 2019-11-06 ENCOUNTER — Ambulatory Visit: Payer: Managed Care, Other (non HMO) | Admitting: Nurse Practitioner

## 2019-11-06 ENCOUNTER — Encounter: Payer: Self-pay | Admitting: Nurse Practitioner

## 2019-11-06 VITALS — BP 129/73 | HR 58 | Ht 63.0 in | Wt 204.0 lb

## 2019-11-06 DIAGNOSIS — Z0000 Encounter for general adult medical examination without abnormal findings: Secondary | ICD-10-CM

## 2019-11-06 DIAGNOSIS — Z008 Encounter for other general examination: Secondary | ICD-10-CM | POA: Diagnosis not present

## 2019-11-06 NOTE — Progress Notes (Signed)
Subjective:     Patient ID: Elizabeth Mahoney, female   DOB: [birth year], 44 y.o.   MRN: [id number]  HPI Iria Jamerson is a 44 y.o. female who presents to the [facility] for her annual biometric physical exam. 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 She reports that she enjoys her job most days. She denies any problems or concerns today.   PCP: [facility]  PMH: Obesity, Elevated BP in past but no medications GYN: HPV, last pap 2021 Surg: Cholecystectomy SH: Occasional ETOH use, denies tobacco or drug use Imm: UTD, has had Covid vaccine  Review of Systems  Constitutional:       Obese  All other systems reviewed and are negative.      Objective: BP 129/73 (BP Location: Right Arm, Patient Position: Sitting, Cuff Size: Normal)   Pulse (!) 58   Ht 5\' 3"  (1.6 m)   Wt 204 lb (92.5 kg)   BMI 36.14 kg/m     Physical Exam Vitals and nursing note reviewed.  Constitutional:      General: She is not in acute distress.    Appearance: She is obese.  HENT:     Head: Normocephalic and atraumatic.     Right Ear: Tympanic membrane, ear canal and external ear normal.     Left Ear: Tympanic membrane, ear canal and external ear normal.     Nose: Nose normal.     Mouth/Throat:     Mouth: Mucous membranes are moist.     Pharynx: Oropharynx is clear.  Eyes:     Extraocular Movements: Extraocular movements intact.     Conjunctiva/sclera: Conjunctivae normal.  Neck:     Thyroid: No thyroid mass or thyroid tenderness.     Vascular: Normal carotid pulses. No carotid bruit or JVD.     Trachea: Trachea normal. No tracheal tenderness.  Cardiovascular:     Rate and Rhythm: Regular rhythm. Bradycardia present.     Pulses:          Radial pulses are 2+ on the right side and 2+ on the left side.       Dorsalis pedis pulses are 2+ on the right side and 2+ on the left side.     Heart sounds: No murmur heard.   Pulmonary:     Effort: Pulmonary effort is normal.      Breath sounds: Normal breath sounds and air entry.  Abdominal:     General: Bowel sounds are normal.     Palpations: Abdomen is soft.     Tenderness: There is no abdominal tenderness. There is no right CVA tenderness or left CVA tenderness.  Genitourinary:    Comments: Exam by GYN 2021 Musculoskeletal:        General: No swelling. Normal range of motion.     Cervical back: Normal range of motion. No rigidity or tenderness. No spinous process tenderness.     Right lower leg: No edema.     Left lower leg: No edema.  Lymphadenopathy:     Cervical: No cervical adenopathy.  Skin:    General: Skin is warm and dry.  Neurological:     General: No focal deficit present.     Mental Status: She is alert and oriented to person, place, and time.     Cranial Nerves: No facial asymmetry.     Sensory: No sensory deficit.     Motor: No weakness or pronator drift.     Coordination:  Romberg sign negative.     Gait: Gait is intact.     Deep Tendon Reflexes:     Reflex Scores:      Bicep reflexes are 2+ on the right side and 2+ on the left side.      Brachioradialis reflexes are 2+ on the right side and 2+ on the left side.      Patellar reflexes are 2+ on the right side and 2+ on the left side.    Comments: Ambulatory with steady gait. Stands on one foot without difficulty. Straight leg raises without difficulty. Grips are equal, radial and pedal pulses 2+.   Psychiatric:        Mood and Affect: Mood and affect normal.        Speech: Speech normal.        Thought Content: Thought content normal.        Judgment: Judgment normal.        Assessment:    1. Encounter for biometric screening - Lipid panel - Glucose, random  2. Encounter for preventive health examination - Lipid panel - Glucose, random Limited physical exam today is normal.    Plan:    discussed with employee healthy diet and exercises and immunizations. Instructions verbally and with d/c papers. Employee given  opportunity to ask questions. All questions answered and employee voices understanding. F/u in one year or sooner if needed. F/u with PCP for routine health care.

## 2019-11-06 NOTE — Patient Instructions (Signed)
Health Maintenance, Female Adopting a healthy lifestyle and getting preventive care are important in promoting health and wellness. Ask your health care provider about:  The right schedule for you to have regular tests and exams.  Things you can do on your own to prevent diseases and keep yourself healthy. What should I know about diet, weight, and exercise? Eat a healthy diet   Eat a diet that includes plenty of vegetables, fruits, low-fat dairy products, and lean protein.  Do not eat a lot of foods that are high in solid fats, added sugars, or sodium. Maintain a healthy weight Body mass index (BMI) is used to identify weight problems. It estimates body fat based on height and weight. Your health care provider can help determine your BMI and help you achieve or maintain a healthy weight. Get regular exercise Get regular exercise. This is one of the most important things you can do for your health. Most adults should:  Exercise for at least 150 minutes each week. The exercise should increase your heart rate and make you sweat (moderate-intensity exercise).  Do strengthening exercises at least twice a week. This is in addition to the moderate-intensity exercise.  Spend less time sitting. Even light physical activity can be beneficial. Watch cholesterol and blood lipids Have your blood tested for lipids and cholesterol at 44 years of age, then have this test every 5 years. Have your cholesterol levels checked more often if:  Your lipid or cholesterol levels are high.  You are older than 44 years of age.  You are at high risk for heart disease. What should I know about cancer screening? Depending on your health history and family history, you may need to have cancer screening at various ages. This may include screening for:  Breast cancer.  Cervical cancer.  Colorectal cancer.  Skin cancer.  Lung cancer. What should I know about heart disease, diabetes, and high blood  pressure? Blood pressure and heart disease  High blood pressure causes heart disease and increases the risk of stroke. This is more likely to develop in people who have high blood pressure readings, are of African descent, or are overweight.  Have your blood pressure checked: ? Every 3-5 years if you are 54-62 years of age. ? Every year if you are 93 years old or older. Diabetes Have regular diabetes screenings. This checks your fasting blood sugar level. Have the screening done:  Once every three years after age 69 if you are at a normal weight and have a low risk for diabetes.  More often and at a younger age if you are overweight or have a high risk for diabetes. What should I know about preventing infection? Hepatitis B If you have a higher risk for hepatitis B, you should be screened for this virus. Talk with your health care provider to find out if you are at risk for hepatitis B infection. Hepatitis C Testing is recommended for:  Everyone born from [birth year] through 1965.  Anyone with known risk factors for hepatitis C. Sexually transmitted infections (STIs)  Get screened for STIs, including gonorrhea and chlamydia, if: ? You are sexually active and are younger than 44 years of age. ? You are older than 44 years of age and your health care provider tells you that you are at risk for this type of infection. ? Your sexual activity has changed since you were last screened, and you are at increased risk for chlamydia or gonorrhea. Ask your health care provider if  you are at risk.  Ask your health care provider about whether you are at high risk for HIV. Your health care provider may recommend a prescription medicine to help prevent HIV infection. If you choose to take medicine to prevent HIV, you should first get tested for HIV. You should then be tested every 3 months for as long as you are taking the medicine. Pregnancy  If you are about to stop having your period (premenopausal) and  you may become pregnant, seek counseling before you get pregnant.  Take 400 to 800 micrograms (mcg) of folic acid every day if you become pregnant.  Ask for birth control (contraception) if you want to prevent pregnancy. Osteoporosis and menopause Osteoporosis is a disease in which the bones lose minerals and strength with aging. This can result in bone fractures. If you are 40 years old or older, or if you are at risk for osteoporosis and fractures, ask your health care provider if you should:  Be screened for bone loss.  Take a calcium or vitamin D supplement to lower your risk of fractures.  Be given hormone replacement therapy (HRT) to treat symptoms of menopause. Follow these instructions at home: Lifestyle  Do not use any products that contain nicotine or tobacco, such as cigarettes, e-cigarettes, and chewing tobacco. If you need help quitting, ask your health care provider.  Do not use street drugs.  Do not share needles.  Ask your health care provider for help if you need support or information about quitting drugs. Alcohol use  Do not drink alcohol if: ? Your health care provider tells you not to drink. ? You are pregnant, may be pregnant, or are planning to become pregnant.  If you drink alcohol: ? Limit how much you use to 0-1 drink a day. ? Limit intake if you are breastfeeding.  Be aware of how much alcohol is in your drink. In the [place] one drink equals one 12 oz bottle of beer (355 mL), one 5 oz glass of wine (148 mL), or one 1 oz glass of hard liquor (44 mL). General instructions  Schedule regular health, dental, and eye exams.  Stay current with your vaccines.  Tell your health care provider if: ? You often feel depressed. ? You have ever been abused or do not feel safe at home. Summary  Adopting a healthy lifestyle and getting preventive care are important in promoting health and wellness.  Follow your health care provider's instructions about healthy  diet, exercising, and getting tested or screened for diseases.  Follow your health care provider's instructions on monitoring your cholesterol and blood pressure. This information is not intended to replace advice given to you by your health care provider. Make sure you discuss any questions you have with your health care provider. Document Revised: 01/08/2018 Document Reviewed: 01/08/2018 Elsevier Patient Education  2020 [employer].  Immunization Schedule, 30-59 Years Old  Vaccines are usually given at various ages, according to a schedule. Your health care provider will recommend vaccines for you based on your age, medical history, and lifestyle or other factors, such as travel or where you work. You may receive vaccines as individual doses or as more than one vaccine together in one shot (combination vaccines). Talk with your health care provider about the risks and benefits of combination vaccines. Recommended immunizations for 42-32 years old Influenza vaccine  You should get a dose of the influenza vaccine every year. Tetanus, diphtheria, and pertussis vaccine A vaccine that protects against tetanus,  diphtheria, and pertussis is known as the Tdap vaccine. A vaccine that protects against tetanus and diphtheria is known as the Td vaccine.  You should only get the Td vaccine if you have had at least 1 dose of the Tdap vaccine.  You should get 1 dose of the Td or Tdap vaccine every 10 years, or you should get 1 dose of the Tdap vaccine if: ? You have not previously gotten a Tdap vaccine. ? You do not know if you have ever gotten a Tdap vaccine. ? You are between 27 and [redacted] weeks pregnant. Measles, mumps, and rubella vaccine This is also known as the MMR vaccine. You may need to get the MMR vaccine if:  You need to catch up on doses you missed in the past.  You have not been given the vaccine before.  You do not have evidence of immunity (by a blood test). Pregnant women should not  get the MMR vaccine during pregnancy because it may be harmful to the unborn baby. However, if you are not immune to measles, mumps, or rubella, you should get a dose of MMR vaccine one month or more before pregnancy or within days after delivery. Varicella vaccine This is also known as the VAR vaccine. You may need to get the VAR vaccine if you were born in 1980 or later and:  You need to catch up on doses you missed in the past.  You have not been given the vaccine before.  You do not have evidence of immunity (by a blood test).  You have certain high-risk conditions, such as HIV or AIDS. Pregnant women should not get the VAR vaccine during pregnancy because it may be harmful to the unborn baby. However, if you are not immune to chickenpox (varicella), you should get a dose of the VAR vaccine within days after delivery. Human papillomavirus vaccine This is also known as the HPV vaccine. If you have not gotten the vaccine before or you missed doses in the past, talk to your health care provider about whether it is appropriate for you to get the HPV vaccine. Pneumococcal conjugate vaccine This is also known as the PCV13 vaccine. You should get the PCV13 vaccine as recommended if you have certain high-risk conditions. These include:  Diabetes.  Chronic conditions of the heart, lungs, or liver.  Conditions that affect the body's disease-fighting system (immune system). Pneumococcal polysaccharide vaccine This is also known as the PPSV23 vaccine. You should get the PPSV23 vaccine as recommended if you have certain high-risk conditions. These include:  Diabetes.  Chronic conditions of the heart, lungs, or liver.  Conditions that affect the immune system. Hepatitis A vaccine This is also known as the HepA vaccine. If you did not get the HepA vaccine previously, you should get it if:  You are at risk for a hepatitis A infection. You may be at risk for infection if you: ? Have chronic  liver disease. ? Have HIV or AIDS. ? Are a man who has sex with men. ? Use drugs. ? Are homeless. ? May be exposed to hepatitis A through work. ? Travel to countries where hepatitis A is common. ? Are pregnant. ? Have or will have close contact with someone who was adopted from another country.  You are not at risk for infection but want protection from hepatitis A. Hepatitis B vaccine This is also known as the HepB vaccine. If you did not get the HepB vaccine previously, you should get it  if:  You are at risk for hepatitis B infection. You are at risk if you: ? Have chronic liver disease. ? Have HIV or AIDS. ? Have sex with a partner who has hepatitis B, or:  You have multiple sex partners.  You are a man who has sex with men. ? Use drugs. ? May be exposed to hepatitis B through work. ? Live with someone who has hepatitis B. ? Receive dialysis treatment. ? Have diabetes. ? Travel to countries where hepatitis B is common. ? Are pregnant.  You are not at risk of infection but want protection from hepatitis B. Meningococcal conjugate vaccine This is also known as the MenACWY vaccine. You may need to get the MenACWY vaccine if you:  Have not been given the vaccine before.  Need to catch up on doses you missed in the past. This vaccine is especially important if you:  Do not have a spleen.  Have sickle cell disease.  Have HIV.  Take medicines that suppress your immune system.  Travel to countries where meningococcal disease is common.  Are exposed to Neisseria meningitidis at work. Serogroup B meningococcal vaccine This is also known as the MenB vaccine. You may need to get the MenB vaccine if you:  Have not been given the vaccine before.  Need to catch up on doses you missed in the past. This vaccine is especially important if you:  Do not have a spleen.  Have sickle cell disease.  Take medicines that suppress your immune system.  Are exposed to Neisseria  meningitidis at work. Haemophilus influenzae type b vaccine This is also known as the Hib vaccine. Anyone older than 44 years of age is usually not given the Hib vaccine. However, if you have certain high-risk conditions, you may need to get this vaccine. These conditions include:  Not having a spleen.  Having received a stem cell transplant. Before you get a vaccine: Talk with your health care provider about which vaccines are right for you. This is especially important if:  You previously had a reaction after getting a vaccine.  You have a weakened immune system. You may have a weakened immune system if you: ? Are taking medicines that reduce (suppress) the activity of your immune system. ? Are taking medicines to treat cancer (chemotherapy). ? Have HIV or AIDS.  You work in an environment where you may be exposed to a disease.  You plan to travel outside of the country.  You have a chronic illness, such as heart disease, kidney disease, diabetes, or lung disease.  You are pregnant, think you may be pregnant, or are planning to become pregnant. Summary  Before you get a vaccine, tell your health care provider if you have reacted to vaccines in the past or have a condition that weakens your immune system.  At 27-49 years, you should get a dose of the influenza vaccine every year and a dose of the Td or Tdap vaccine every 10 years.  Depending on your medical history and your risk factors, you may need other vaccines. Ask your health care provider whether you are up to date on all your vaccines.  Women who are pregnant may not receive certain vaccines. Ask your health care provider whether you should receive any vaccines soon after you deliver your baby. This information is not intended to replace advice given to you by your health care provider. Make sure you discuss any questions you have with your health care provider. Document Revised:  11/12/2018 Document Reviewed:  11/12/2018 Elsevier Patient Education  [place] Screening Exam  A medical screening exam helps determine whether or not you need immediate medical treatment. This type of exam may be done in the emergency department, an urgent care setting, or your health care provider's office. During the exam, a health care provider does a short physical exam and asks about your medical history to assess:  Your current symptoms.  Your overall health. Depending on your symptoms, you may need additional tests. What are the possible outcomes of a medical screening exam? Your medical screening exam may determine that:  You do not need emergency treatment at this time.  You need treatment right away.  You need to be transferred to another medical center.  You need to have more tests. A medical specialist may be consulted if necessary. When should I seek medical care? If you have a regular health care provider, make an appointment for a follow-up visit with him or her. If you do not have a regular health care provider, ask about resources in your community. Get help right away if:  Your condition gets worse or you develop new or troubling symptoms before you see your health care provider. If this occurs, go to an emergency department right away. In an emergency:  Call 911 or have someone drive you to the nearest hospital.  Do not drive yourself. Summary  A medical screening exam helps determine whether or not you need immediate medical treatment.  During the exam, a health care provider does a short physical exam and asks about your current symptoms and overall health.  More tests may be ordered during the exam.  You may need to be transferred to another medical center. This information is not intended to replace advice given to you by your health care provider. Make sure you discuss any questions you have with your health care provider. Document Revised: 05/09/2018 Document  Reviewed: 08/13/2017 Elsevier Patient Education  [place] Many factors influence your heart (coronary) health, including eating and exercise habits. Coronary risk increases with abnormal blood fat (lipid) levels. Heart-healthy meal planning includes limiting unhealthy fats, increasing healthy fats, and making other diet and lifestyle changes. What is my plan? Your health care provider may recommend that you:  Limit your fat intake to _________% or less of your total calories each day.  Limit your saturated fat intake to _________% or less of your total calories each day.  Limit the amount of cholesterol in your diet to less than _________ mg per day. What are tips for following this plan? Cooking Cook foods using methods other than frying. Baking, boiling, grilling, and broiling are all good options. Other ways to reduce fat include:  Removing the skin from poultry.  Removing all visible fats from meats.  Steaming vegetables in water or broth. Meal planning   At meals, imagine dividing your plate into fourths: ? Fill one-half of your plate with vegetables and green salads. ? Fill one-fourth of your plate with whole grains. ? Fill one-fourth of your plate with lean protein foods.  Eat 4-5 servings of vegetables per day. One serving equals 1 cup raw or cooked vegetable, or 2 cups raw leafy greens.  Eat 4-5 servings of fruit per day. One serving equals 1 medium whole fruit,  cup dried fruit,  cup fresh, frozen, or canned fruit, or  cup 100% fruit juice.  Eat more foods that contain soluble fiber. Examples  include apples, broccoli, carrots, beans, peas, and barley. Aim to get 25-30 g of fiber per day.  Increase your consumption of legumes, nuts, and seeds to 4-5 servings per week. One serving of dried beans or legumes equals  cup cooked, 1 serving of nuts is  cup, and 1 serving of seeds equals 1 tablespoon. Fats  Choose healthy fats more  often. Choose monounsaturated and polyunsaturated fats, such as olive and canola oils, flaxseeds, walnuts, almonds, and seeds.  Eat more omega-3 fats. Choose salmon, mackerel, sardines, tuna, flaxseed oil, and ground flaxseeds. Aim to eat fish at least 2 times each week.  Check food labels carefully to identify foods with trans fats or high amounts of saturated fat.  Limit saturated fats. These are found in animal products, such as meats, butter, and cream. Plant sources of saturated fats include palm oil, palm kernel oil, and coconut oil.  Avoid foods with partially hydrogenated oils in them. These contain trans fats. Examples are stick margarine, some tub margarines, cookies, crackers, and other baked goods.  Avoid fried foods. General information  Eat more home-cooked food and less restaurant, buffet, and fast food.  Limit or avoid alcohol.  Limit foods that are high in starch and sugar.  Lose weight if you are overweight. Losing just 5-10% of your body weight can help your overall health and prevent diseases such as diabetes and heart disease.  Monitor your salt (sodium) intake, especially if you have high blood pressure. Talk with your health care provider about your sodium intake.  Try to incorporate more vegetarian meals weekly. What foods can I eat? Fruits All fresh, canned (in natural juice), or frozen fruits. Vegetables Fresh or frozen vegetables (raw, steamed, roasted, or grilled). Green salads. Grains Most grains. Choose whole wheat and whole grains most of the time. Rice and pasta, including brown rice and pastas made with whole wheat. Meats and other proteins Lean, well-trimmed beef, veal, pork, and lamb. Chicken and [place] without skin. All fish and shellfish. Wild duck, rabbit, pheasant, and venison. Egg whites or low-cholesterol egg substitutes. Dried beans, peas, lentils, and tofu. Seeds and most nuts. Dairy Low-fat or nonfat cheeses, including ricotta and  mozzarella. Skim or 1% milk (liquid, powdered, or evaporated). Buttermilk made with low-fat milk. Nonfat or low-fat yogurt. Fats and oils Non-hydrogenated (trans-free) margarines. Vegetable oils, including soybean, sesame, sunflower, olive, peanut, safflower, corn, canola, and cottonseed. Salad dressings or mayonnaise made with a vegetable oil. Beverages Water (mineral or sparkling). Coffee and tea. Diet carbonated beverages. Sweets and desserts Sherbet, gelatin, and fruit ice. Small amounts of dark chocolate. Limit all sweets and desserts. Seasonings and condiments All seasonings and condiments. The items listed above may not be a complete list of foods and beverages you can eat. Contact a dietitian for more options. What foods are not recommended? Fruits Canned fruit in heavy syrup. Fruit in cream or butter sauce. Fried fruit. Limit coconut. Vegetables Vegetables cooked in cheese, cream, or butter sauce. Fried vegetables. Grains Breads made with saturated or trans fats, oils, or whole milk. Croissants. Sweet rolls. Donuts. High-fat crackers, such as cheese crackers. Meats and other proteins Fatty meats, such as hot dogs, ribs, sausage, bacon, rib-eye roast or steak. High-fat deli meats, such as salami and bologna. Caviar. Domestic duck and goose. Organ meats, such as liver. Dairy Cream, sour cream, cream cheese, and creamed cottage cheese. Whole milk cheeses. Whole or 2% milk (liquid, evaporated, or condensed). Whole buttermilk. Cream sauce or high-fat cheese sauce. Whole-milk yogurt. Fats  and oils Meat fat, or shortening. Cocoa butter, hydrogenated oils, palm oil, coconut oil, palm kernel oil. Solid fats and shortenings, including bacon fat, salt pork, lard, and butter. Nondairy cream substitutes. Salad dressings with cheese or sour cream. Beverages Regular sodas and any drinks with added sugar. Sweets and desserts Frosting. Pudding. Cookies. Cakes. Pies. Milk chocolate or white  chocolate. Buttered syrups. Full-fat ice cream or ice cream drinks. The items listed above may not be a complete list of foods and beverages to avoid. Contact a dietitian for more information. Summary  Heart-healthy meal planning includes limiting unhealthy fats, increasing healthy fats, and making other diet and lifestyle changes.  Lose weight if you are overweight. Losing just 5-10% of your body weight can help your overall health and prevent diseases such as diabetes and heart disease.  Focus on eating a balance of foods, including fruits and vegetables, low-fat or nonfat dairy, lean protein, nuts and legumes, whole grains, and heart-healthy oils and fats. This information is not intended to replace advice given to you by your health care provider. Make sure you discuss any questions you have with your health care provider. Document Revised: 02/22/2017 Document Reviewed: 02/22/2017 Elsevier Patient Education  2020 [employer].

## 2019-11-07 LAB — LIPID PANEL
Chol/HDL Ratio: 3.3 ratio (ref 0.0–4.4)
Cholesterol, Total: 173 mg/dL (ref 100–199)
HDL: 52 mg/dL (ref >39)
LDL Chol Calc (NIH): 99 mg/dL (ref 0–99)
Triglycerides: 124 mg/dL (ref 0–149)
VLDL Cholesterol Cal: 22 mg/dL (ref 5–40)

## 2019-11-07 LAB — GLUCOSE, RANDOM: Glucose: 89 mg/dL (ref 65–99)

## 2022-12-23 ENCOUNTER — Other Ambulatory Visit: Payer: Self-pay

## 2022-12-23 ENCOUNTER — Encounter (HOSPITAL_COMMUNITY): Payer: Self-pay

## 2022-12-23 ENCOUNTER — Emergency Department (HOSPITAL_COMMUNITY): Payer: Self-pay

## 2022-12-23 ENCOUNTER — Emergency Department (HOSPITAL_COMMUNITY)
Admission: EM | Admit: 2022-12-23 | Discharge: 2022-12-23 | Disposition: A | Discharge status: Home / Self Care | Payer: Self-pay | Attending: Emergency Medicine | Admitting: Emergency Medicine

## 2022-12-23 DIAGNOSIS — N200 Calculus of kidney: Secondary | ICD-10-CM

## 2022-12-23 DIAGNOSIS — N132 Hydronephrosis with renal and ureteral calculous obstruction: Secondary | ICD-10-CM | POA: Insufficient documentation

## 2022-12-23 DIAGNOSIS — D72829 Elevated white blood cell count, unspecified: Secondary | ICD-10-CM | POA: Insufficient documentation

## 2022-12-23 LAB — COMPREHENSIVE METABOLIC PANEL
ALT: 24 U/L (ref 0–44)
AST: 21 U/L (ref 15–41)
Albumin: 3.9 g/dL (ref 3.5–5.0)
Alkaline Phosphatase: 84 U/L (ref 38–126)
Anion gap: 10 (ref 5–15)
BUN: 13 mg/dL (ref 6–20)
CO2: 22 mmol/L (ref 22–32)
Calcium: 8.9 mg/dL (ref 8.9–10.3)
Chloride: 103 mmol/L (ref 98–111)
Creatinine, Ser: 0.98 mg/dL (ref 0.44–1.00)
GFR, Estimated: >60 mL/min (ref >60)
Glucose, Bld: 121 mg/dL — ABNORMAL HIGH (ref 70–99)
Potassium: 4.2 mmol/L (ref 3.5–5.1)
Sodium: 135 mmol/L (ref 135–145)
Total Bilirubin: 0.7 mg/dL (ref <1.2)
Total Protein: 7.5 g/dL (ref 6.5–8.1)

## 2022-12-23 LAB — URINALYSIS, ROUTINE W REFLEX MICROSCOPIC
Bacteria, UA: NONE SEEN
Bilirubin Urine: NEGATIVE
Glucose, UA: NEGATIVE mg/dL
Ketones, ur: NEGATIVE mg/dL
Leukocytes,Ua: NEGATIVE
Nitrite: NEGATIVE
Protein, ur: NEGATIVE mg/dL
Specific Gravity, Urine: 1.019 (ref 1.005–1.030)
pH: 5 (ref 5.0–8.0)

## 2022-12-23 LAB — CBC WITH DIFFERENTIAL/PLATELET
Abs Immature Granulocytes: 0.03 10*3/uL (ref 0.00–0.07)
Basophils Absolute: 0 10*3/uL (ref 0.0–0.1)
Basophils Relative: 0 %
Eosinophils Absolute: 0 10*3/uL (ref 0.0–0.5)
Eosinophils Relative: 0 %
HCT: 43.5 % (ref 36.0–46.0)
Hemoglobin: 14.3 g/dL (ref 12.0–15.0)
Immature Granulocytes: 0 %
Lymphocytes Relative: 6 %
Lymphs Abs: 0.8 10*3/uL (ref 0.7–4.0)
MCH: 27 pg (ref 26.0–34.0)
MCHC: 32.9 g/dL (ref 30.0–36.0)
MCV: 82.1 fL (ref 80.0–100.0)
Monocytes Absolute: 0.4 10*3/uL (ref 0.1–1.0)
Monocytes Relative: 4 %
Neutro Abs: 10.6 10*3/uL — ABNORMAL HIGH (ref 1.7–7.7)
Neutrophils Relative %: 90 %
Platelets: 268 10*3/uL (ref 150–400)
RBC: 5.3 MIL/uL — ABNORMAL HIGH (ref 3.87–5.11)
RDW: 14.7 % (ref 11.5–15.5)
WBC: 11.9 10*3/uL — ABNORMAL HIGH (ref 4.0–10.5)
nRBC: 0 % (ref 0.0–0.2)

## 2022-12-23 LAB — HCG, SERUM, QUALITATIVE: Preg, Serum: NEGATIVE

## 2022-12-23 LAB — PREGNANCY, URINE: Preg Test, Ur: NEGATIVE

## 2022-12-23 MED ORDER — ONDANSETRON HCL 4 MG PO TABS: 4.0000 mg | ORAL_TABLET | Freq: Four times a day (QID) | ORAL | 0 refills | Status: AC

## 2022-12-23 MED ORDER — IOHEXOL 300 MG/ML  SOLN
100.0000 mL | Freq: Once | INTRAMUSCULAR | Status: AC | PRN
  Administered 2022-12-23: 100 mL via INTRAVENOUS

## 2022-12-23 MED ORDER — ONDANSETRON HCL 4 MG/2ML IJ SOLN
4.0000 mg | Freq: Once | INTRAMUSCULAR | Status: AC
  Administered 2022-12-23: 4 mg via INTRAVENOUS
  Filled 2022-12-23: qty 2

## 2022-12-23 MED ORDER — KETOROLAC TROMETHAMINE 30 MG/ML IJ SOLN
30.0000 mg | Freq: Once | INTRAMUSCULAR | Status: AC
  Administered 2022-12-23: 30 mg via INTRAVENOUS
  Filled 2022-12-23: qty 1

## 2022-12-23 MED ORDER — OXYCODONE-ACETAMINOPHEN 5-325 MG PO TABS: 1.0000 | ORAL_TABLET | Freq: Three times a day (TID) | ORAL | 0 refills | Status: AC | PRN

## 2022-12-23 MED ORDER — HYDROMORPHONE HCL 1 MG/ML IJ SOLN
1.0000 mg | Freq: Once | INTRAMUSCULAR | Status: AC
  Administered 2022-12-23: 1 mg via INTRAVENOUS
  Filled 2022-12-23: qty 1

## 2022-12-23 MED ORDER — TAMSULOSIN HCL 0.4 MG PO CAPS: 0.4000 mg | ORAL_CAPSULE | Freq: Every day | ORAL | 0 refills | Status: AC

## 2022-12-23 MED ORDER — MORPHINE SULFATE (PF) 4 MG/ML IV SOLN
4.0000 mg | Freq: Once | INTRAVENOUS | Status: AC
  Administered 2022-12-23: 4 mg via INTRAVENOUS
  Filled 2022-12-23: qty 1

## 2022-12-23 MED ORDER — NAPROXEN 375 MG PO TABS: 375.0000 mg | ORAL_TABLET | Freq: Two times a day (BID) | ORAL | 0 refills | Status: AC

## 2022-12-23 NOTE — ED Triage Notes (Signed)
Patient reports right lower abdominal pain since this morning. Reports feeling nauseous but denies any vomiting. No bowel or bladder complaints. Still has appendix.

## 2022-12-23 NOTE — ED Provider Notes (Signed)
[place] EMERGENCY DEPARTMENT AT [facility] Provider Note   CSN: [id number] Arrival date & time: 12/23/22  1148     History  Chief Complaint  Patient presents with   Abdominal Pain   Nausea    Elizabeth Mahoney is a 47 y.o. female.  HPI   47 year old female presents emergency department with right mid abdominal pain.  This started about 3 to 4 hours prior to arrival.  She states that she had a normal appetite, ate without difficulty today and then developed right sided abdominal pain.  No history of kidney stones.  She is status postcholecystectomy.  States that the pain wraps around to her right flank.  Denies any dysuria, frequency or hematuria.  She felt nauseous earlier but has not any vomiting.  Had 1 episode of nonbloody diarrhea.  Pain does not radiate into her groin/pelvis.  No abnormal vaginal bleeding or discharge.  Home Medications Prior to Admission medications   Medication Sig Start Date End Date Taking? Authorizing Provider  levonorgestrel-ethinyl estradiol (LYBREL) 90-20 MCG tablet Take 1 tablet by mouth daily. 10/28/19   [provider]  sertraline (ZOLOFT) 50 MG tablet sertraline 50 mg tablet    [provider]  sertraline (ZOLOFT) 50 MG tablet Take 50 mg by mouth daily. 08/17/19   [provider]  SODIUM FLUORIDE, DENTAL RINSE, 0.2 % SOLN SWISH FOR 30 SECONDS AND SPIT, PREFERABLY BEFORE BEDTIME. DO NOT SWALLOW 11/03/19   [provider]      Allergies    Penicillins    Review of Systems   Review of Systems  Constitutional:  Negative for fever.  Respiratory:  Negative for shortness of breath.   Cardiovascular:  Negative for chest pain.  Gastrointestinal:  Positive for abdominal pain, diarrhea and nausea. Negative for rectal pain and vomiting.  Genitourinary:  Positive for flank pain. Negative for dysuria, frequency, pelvic pain, vaginal bleeding and vaginal discharge.  Musculoskeletal:  Negative for back  pain.  Skin:  Negative for rash.  Neurological:  Negative for headaches.    Physical Exam Updated Vital Signs BP (!) 195/142   Pulse 62   Temp 98 F (36.7 C) (Oral)   Resp 20   Ht 5\' 3"  (1.6 m)   Wt 92.5 kg   SpO2 100%   BMI 36.12 kg/m  Physical Exam Vitals and nursing note reviewed.  Constitutional:      Appearance: Normal appearance.  HENT:     Head: Normocephalic.     Mouth/Throat:     Mouth: Mucous membranes are moist.  Cardiovascular:     Rate and Rhythm: Normal rate.  Pulmonary:     Effort: Pulmonary effort is normal. No respiratory distress.  Abdominal:     General: Bowel sounds are normal. There is no distension.     Palpations: Abdomen is soft.     Tenderness: There is abdominal tenderness in the right lower quadrant and periumbilical area. There is no guarding or rebound.  Skin:    General: Skin is warm.  Neurological:     Mental Status: She is alert and oriented to person, place, and time. Mental status is at baseline.  Psychiatric:        Mood and Affect: Mood normal.     ED Results / Procedures / Treatments   Labs (all labs ordered are listed, but only abnormal results are displayed) Labs Reviewed  CBC WITH DIFFERENTIAL/PLATELET  COMPREHENSIVE METABOLIC PANEL  URINALYSIS, ROUTINE W REFLEX MICROSCOPIC  PREGNANCY, URINE  HCG, SERUM, QUALITATIVE    EKG None  Radiology No results found.  Procedures Procedures    Medications Ordered in ED Medications  ondansetron (ZOFRAN) injection 4 mg (has no administration in time range)  morphine (PF) 4 MG/ML injection 4 mg (has no administration in time range)    ED Course/ Medical Decision Making/ A&P                                 Medical Decision Making Amount and/or Complexity of Data Reviewed Labs: ordered. Radiology: ordered.  Risk Prescription drug management.   47 year old female presents emergency department right-sided abdominal pain.  Associated with nausea and diarrhea.   Denies any GU complaints.    Patient states that she currently is not pregnant.  She is requesting medicine for symptom control.  She declines waiting for negative pregnancy test and is requesting medicine now.  She understands the possible risks associated with this.  Workup is significant for a mild leukocytosis, urinalysis is a large amount of blood, pregnancy test is negative.  CT of the abdomen pelvis shows a 3 mm right-sided stone with some mild hydronephrosis and stranding.  There are other incidental findings, none of which are emergent.  They mention some liver findings but she has normal LFTs.  They mentioned a nonspecific finding in one of the vertebrae, she has no acute pain or history of malignancy.    Kidney function is normal, urinalysis shows no UTI. After IV medicine patient feels improved.  She has had no nausea/vomiting.  She feels appropriate being discharged home with oral medications and outpatient urology follow-up.  Patient at this time appears safe and stable for discharge and close outpatient follow up. Discharge plan and strict return to ED precautions discussed, patient verbalizes understanding and agreement.        Final Clinical Impression(s) / ED Diagnoses Final diagnoses:  None    Rx / DC Orders ED Discharge Orders     None         Rozelle Logan, DO 12/23/22 2020

## 2022-12-23 NOTE — ED Notes (Signed)
Patient transported to CT 

## 2022-12-23 NOTE — Discharge Instructions (Signed)
You have been seen and discharged from the emergency department.  You are diagnosed with a kidney stone on the right.  Stay well-hydrated, drink lots of fluids.  Take naproxen for pain control.  You may use stronger pain medicine as needed.  Do not mix this medication with alcohol or other sedating medications. Do not drive or do heavy physical activity until you know how this medication affects you.  It may cause drowsiness.  Take other medications as directed.  Follow-up with your primary provider for further evaluation and further care. Take home medications as prescribed. If you have any worsening symptoms, uncontrolled pain, vomiting, high fevers or further concerns for your health please return to an emergency department for further evaluation.
